# Patient Record
Sex: Female | Born: 1970 | Race: White | Hispanic: No | Marital: Married | Smoking: Never smoker
Health system: Southern US, Community
[De-identification: ages and names within clinical notes are randomized; demographics above are authoritative.]

## PROBLEM LIST (undated history)

## (undated) DIAGNOSIS — E7800 Pure hypercholesterolemia, unspecified: Secondary | ICD-10-CM

## (undated) DIAGNOSIS — M797 Fibromyalgia: Secondary | ICD-10-CM

## (undated) DIAGNOSIS — J302 Other seasonal allergic rhinitis: Secondary | ICD-10-CM

## (undated) DIAGNOSIS — J342 Deviated nasal septum: Secondary | ICD-10-CM

## (undated) DIAGNOSIS — M7918 Myalgia, other site: Secondary | ICD-10-CM

## (undated) DIAGNOSIS — T8859XA Other complications of anesthesia, initial encounter: Secondary | ICD-10-CM

## (undated) DIAGNOSIS — R112 Nausea with vomiting, unspecified: Secondary | ICD-10-CM

## (undated) DIAGNOSIS — G2581 Restless legs syndrome: Secondary | ICD-10-CM

## (undated) DIAGNOSIS — T4145XA Adverse effect of unspecified anesthetic, initial encounter: Secondary | ICD-10-CM

## (undated) DIAGNOSIS — S32009K Unspecified fracture of unspecified lumbar vertebra, subsequent encounter for fracture with nonunion: Secondary | ICD-10-CM

## (undated) DIAGNOSIS — M1990 Unspecified osteoarthritis, unspecified site: Secondary | ICD-10-CM

## (undated) DIAGNOSIS — M5020 Other cervical disc displacement, unspecified cervical region: Secondary | ICD-10-CM

## (undated) DIAGNOSIS — Z9889 Other specified postprocedural states: Secondary | ICD-10-CM

## (undated) HISTORY — PX: NO PAST SURGERIES: SHX2092

## (undated) HISTORY — PX: WISDOM TOOTH EXTRACTION: SHX21

---

## 2015-06-10 ENCOUNTER — Other Ambulatory Visit (HOSPITAL_COMMUNITY): Payer: Self-pay | Admitting: Neurological Surgery

## 2015-06-19 ENCOUNTER — Encounter (HOSPITAL_COMMUNITY)
Admission: RE | Admit: 2015-06-19 | Discharge: 2015-06-19 | Disposition: A | Discharge status: Home / Self Care | Payer: Medicare Other | Source: Ambulatory Visit | Attending: Neurological Surgery | Admitting: Neurological Surgery

## 2015-06-19 ENCOUNTER — Encounter (HOSPITAL_COMMUNITY): Payer: Self-pay

## 2015-06-19 DIAGNOSIS — Z01812 Encounter for preprocedural laboratory examination: Secondary | ICD-10-CM | POA: Insufficient documentation

## 2015-06-19 DIAGNOSIS — IMO0002 Reserved for concepts with insufficient information to code with codable children: Secondary | ICD-10-CM

## 2015-06-19 DIAGNOSIS — Z01818 Encounter for other preprocedural examination: Secondary | ICD-10-CM | POA: Diagnosis present

## 2015-06-19 HISTORY — DX: Other seasonal allergic rhinitis: J30.2

## 2015-06-19 HISTORY — DX: Fibromyalgia: M79.7

## 2015-06-19 HISTORY — DX: Pure hypercholesterolemia, unspecified: E78.00

## 2015-06-19 HISTORY — DX: Myalgia, other site: M79.18

## 2015-06-19 HISTORY — DX: Deviated nasal septum: J34.2

## 2015-06-19 LAB — CBC WITH DIFFERENTIAL/PLATELET
BASOS ABS: 0 10*3/uL (ref 0.0–0.1)
BASOS PCT: 0 %
Eosinophils Absolute: 0.1 10*3/uL (ref 0.0–0.7)
Eosinophils Relative: 1 %
HEMATOCRIT: 43.3 % (ref 36.0–46.0)
HEMOGLOBIN: 14.2 g/dL (ref 12.0–15.0)
LYMPHS PCT: 21 %
Lymphs Abs: 2.7 10*3/uL (ref 0.7–4.0)
MCH: 29.4 pg (ref 26.0–34.0)
MCHC: 32.8 g/dL (ref 30.0–36.0)
MCV: 89.6 fL (ref 78.0–100.0)
Monocytes Absolute: 0.5 10*3/uL (ref 0.1–1.0)
Monocytes Relative: 4 %
NEUTROS ABS: 9.7 10*3/uL — AB (ref 1.7–7.7)
NEUTROS PCT: 74 %
Platelets: 345 10*3/uL (ref 150–400)
RBC: 4.83 MIL/uL (ref 3.87–5.11)
RDW: 13.1 % (ref 11.5–15.5)
WBC: 13 10*3/uL — AB (ref 4.0–10.5)

## 2015-06-19 LAB — BASIC METABOLIC PANEL
ANION GAP: 10 (ref 5–15)
BUN: 10 mg/dL (ref 6–20)
CALCIUM: 9.7 mg/dL (ref 8.9–10.3)
CO2: 26 mmol/L (ref 22–32)
Chloride: 107 mmol/L (ref 101–111)
Creatinine, Ser: 0.83 mg/dL (ref 0.44–1.00)
Glucose, Bld: 92 mg/dL (ref 65–99)
POTASSIUM: 4.7 mmol/L (ref 3.5–5.1)
Sodium: 143 mmol/L (ref 135–145)

## 2015-06-19 LAB — PROTIME-INR
INR: 1.11 (ref 0.00–1.49)
Prothrombin Time: 14.5 seconds (ref 11.6–15.2)

## 2015-06-19 LAB — SURGICAL PCR SCREEN
MRSA, PCR: NEGATIVE
STAPHYLOCOCCUS AUREUS: NEGATIVE

## 2015-06-19 LAB — HCG, SERUM, QUALITATIVE: PREG SERUM: NEGATIVE

## 2015-06-19 NOTE — Progress Notes (Signed)
Patient denies chest pain, shortness of breath, or cardiac test. PCP Dr. Federico FlakeStallworth with [facility] last OV in Care Everywhere.

## 2015-06-19 NOTE — Pre-Procedure Instructions (Signed)
Angela Sims  06/19/2015     Your procedure is scheduled on October 19.  [facility] at 9:15 A.M.  Call this number if you have problems the morning of surgery:  [phone]   Remember:  Do not eat food or drink liquids after midnight.  Take these medicines the morning of surgery with A SIP OF WATER Albuterol, Birth Control   STOP Celebrex, Multiple Vitamins today   STOP/ Do not take Aspirin, Aleve, Naproxen, Advil, Ibuprofen, Motrin, Vitamins, Herbs, or Supplements starting today   Do not wear jewelry, make-up or nail polish.  Do not wear lotions, powders, or perfumes.  You may wear deodorant.  Do not shave 48 hours prior to surgery.  Men may shave face and neck.  Do not bring valuables to the hospital.  [facility] is not responsible for any belongings or valuables.  Contacts, dentures or bridgework may not be worn into surgery.  Leave your suitcase in the car.  After surgery it may be brought to your room.  For patients admitted to the hospital, discharge time will be determined by your treatment team.  Patients discharged the day of surgery will not be allowed to drive home.   [place] - Preparing for Surgery  Before surgery, you can play an important role.  Because skin is not sterile, your skin needs to be as free of germs as possible.  You can reduce the number of germs on you skin by washing with CHG (chlorahexidine gluconate) soap before surgery.  CHG is an antiseptic cleaner which kills germs and bonds with the skin to continue killing germs even after washing.  Please DO NOT use if you have an allergy to CHG or antibacterial soaps.  If your skin becomes reddened/irritated stop using the CHG and inform your nurse when you arrive at Short Stay.  Do not shave (including legs and underarms) for at least 48 hours prior to the first CHG shower.  You may shave your face.  Please follow these instructions carefully:   1.  Shower  with CHG Soap the night before surgery and the morning of Surgery.  2.  If you choose to wash your hair, wash your hair first as usual with your normal shampoo.  3.  After you shampoo, rinse your hair and body thoroughly to remove the shampoo.  4.  Use CHG as you would any other liquid soap.  You can apply CHG directly to the skin and wash gently with scrungie or a clean washcloth.  5.  Apply the CHG Soap to your body ONLY FROM THE NECK DOWN.  Do not use on open wounds or open sores.  Avoid contact with your eyes, ears, mouth and genitals (private parts).  Wash genitals (private parts) with your normal soap.  6.  Wash thoroughly, paying special attention to the area where your surgery will be performed.  7.  Thoroughly rinse your body with warm water from the neck down.  8.  DO NOT shower/wash with your normal soap after using and rinsing off the CHG Soap.  9.  Pat yourself dry with a clean towel.            10.  Wear clean pajamas.            11.  Place clean sheets on your bed the night of your first shower and do not sleep with pets.  Day of Surgery  Do not apply any lotions  the morning of surgery.  Please wear clean clothes to the hospital/surgery center.   Please read over the following fact sheets that you were given. Pain Booklet, Coughing and Deep Breathing and Surgical Site Infection Prevention

## 2015-06-25 ENCOUNTER — Ambulatory Visit (HOSPITAL_COMMUNITY): Payer: No Typology Code available for payment source | Admitting: Certified Registered Nurse Anesthetist

## 2015-06-25 ENCOUNTER — Encounter (HOSPITAL_COMMUNITY)
Admission: RE | Disposition: A | Discharge status: Home / Self Care | Payer: Self-pay | Source: Ambulatory Visit | Attending: Neurological Surgery

## 2015-06-25 ENCOUNTER — Encounter (HOSPITAL_COMMUNITY): Payer: Self-pay | Admitting: Neurological Surgery

## 2015-06-25 ENCOUNTER — Ambulatory Visit (HOSPITAL_COMMUNITY)
Admission: RE | Admit: 2015-06-25 | Discharge: 2015-06-25 | Disposition: A | Discharge status: Home / Self Care | Payer: No Typology Code available for payment source | Source: Ambulatory Visit | Attending: Neurological Surgery | Admitting: Neurological Surgery

## 2015-06-25 ENCOUNTER — Ambulatory Visit (HOSPITAL_COMMUNITY): Payer: No Typology Code available for payment source

## 2015-06-25 DIAGNOSIS — M797 Fibromyalgia: Secondary | ICD-10-CM | POA: Diagnosis not present

## 2015-06-25 DIAGNOSIS — Z419 Encounter for procedure for purposes other than remedying health state, unspecified: Secondary | ICD-10-CM

## 2015-06-25 DIAGNOSIS — Z79899 Other long term (current) drug therapy: Secondary | ICD-10-CM | POA: Insufficient documentation

## 2015-06-25 DIAGNOSIS — Z9889 Other specified postprocedural states: Secondary | ICD-10-CM

## 2015-06-25 DIAGNOSIS — M5117 Intervertebral disc disorders with radiculopathy, lumbosacral region: Secondary | ICD-10-CM | POA: Insufficient documentation

## 2015-06-25 HISTORY — PX: LUMBAR LAMINECTOMY/DECOMPRESSION MICRODISCECTOMY: SHX5026

## 2015-06-25 SURGERY — LUMBAR LAMINECTOMY/DECOMPRESSION MICRODISCECTOMY 1 LEVEL
Anesthesia: General | Site: Back | Laterality: Right

## 2015-06-25 MED ORDER — CELECOXIB 200 MG PO CAPS
200.0000 mg | ORAL_CAPSULE | Freq: Two times a day (BID) | ORAL | Status: DC
  Administered 2015-06-25: 200 mg via ORAL
  Filled 2015-06-25: qty 1

## 2015-06-25 MED ORDER — MENTHOL 3 MG MT LOZG: 1.0000 | LOZENGE | OROMUCOSAL | Status: DC | PRN

## 2015-06-25 MED ORDER — METHOCARBAMOL 500 MG PO TABS: 500.0000 mg | ORAL_TABLET | Freq: Four times a day (QID) | ORAL | Status: DC | PRN

## 2015-06-25 MED ORDER — LACTATED RINGERS IV SOLN
INTRAVENOUS | Status: DC
  Administered 2015-06-25 (×2): via INTRAVENOUS

## 2015-06-25 MED ORDER — SCOPOLAMINE 1 MG/3DAYS TD PT72
MEDICATED_PATCH | TRANSDERMAL | Status: DC | PRN
  Administered 2015-06-25: 1 via TRANSDERMAL

## 2015-06-25 MED ORDER — ROCURONIUM BROMIDE 50 MG/5ML IV SOLN
INTRAVENOUS | Status: AC
  Filled 2015-06-25: qty 1

## 2015-06-25 MED ORDER — HYDROMORPHONE HCL 1 MG/ML IJ SOLN
INTRAMUSCULAR | Status: AC
  Filled 2015-06-25: qty 1

## 2015-06-25 MED ORDER — CEFAZOLIN SODIUM 1-5 GM-% IV SOLN
1.0000 g | Freq: Three times a day (TID) | INTRAVENOUS | Status: DC
  Filled 2015-06-25 (×2): qty 50

## 2015-06-25 MED ORDER — POTASSIUM CHLORIDE IN NACL 20-0.9 MEQ/L-% IV SOLN
INTRAVENOUS | Status: DC
  Filled 2015-06-25 (×2): qty 1000

## 2015-06-25 MED ORDER — DEXAMETHASONE 4 MG PO TABS
4.0000 mg | ORAL_TABLET | Freq: Four times a day (QID) | ORAL | Status: DC
  Administered 2015-06-25: 4 mg via ORAL
  Filled 2015-06-25: qty 1

## 2015-06-25 MED ORDER — OXYCODONE HCL 5 MG PO TABS: 5.0000 mg | ORAL_TABLET | Freq: Once | ORAL | Status: DC | PRN

## 2015-06-25 MED ORDER — METHOCARBAMOL 1000 MG/10ML IJ SOLN
500.0000 mg | Freq: Four times a day (QID) | INTRAVENOUS | Status: DC | PRN
  Filled 2015-06-25: qty 5

## 2015-06-25 MED ORDER — FENTANYL CITRATE (PF) 100 MCG/2ML IJ SOLN
INTRAMUSCULAR | Status: DC | PRN
  Administered 2015-06-25 (×2): 50 ug via INTRAVENOUS
  Administered 2015-06-25: 100 ug via INTRAVENOUS

## 2015-06-25 MED ORDER — PROPOFOL 10 MG/ML IV BOLUS
INTRAVENOUS | Status: DC | PRN
  Administered 2015-06-25: 150 mg via INTRAVENOUS

## 2015-06-25 MED ORDER — MIDAZOLAM HCL 5 MG/5ML IJ SOLN
INTRAMUSCULAR | Status: DC | PRN
  Administered 2015-06-25: 2 mg via INTRAVENOUS

## 2015-06-25 MED ORDER — ONDANSETRON HCL 4 MG/2ML IJ SOLN
INTRAMUSCULAR | Status: DC | PRN
  Administered 2015-06-25: 4 mg via INTRAVENOUS

## 2015-06-25 MED ORDER — ACETAMINOPHEN 650 MG RE SUPP: 650.0000 mg | RECTAL | Status: DC | PRN

## 2015-06-25 MED ORDER — GLYCOPYRROLATE 0.2 MG/ML IJ SOLN
INTRAMUSCULAR | Status: DC | PRN
  Administered 2015-06-25: 0.4 mg via INTRAVENOUS

## 2015-06-25 MED ORDER — SODIUM CHLORIDE 0.9 % IJ SOLN: 3.0000 mL | INTRAMUSCULAR | Status: DC | PRN

## 2015-06-25 MED ORDER — DEXAMETHASONE SODIUM PHOSPHATE 10 MG/ML IJ SOLN
10.0000 mg | INTRAMUSCULAR | Status: AC
  Administered 2015-06-25: 10 mg via INTRAVENOUS
  Filled 2015-06-25: qty 1

## 2015-06-25 MED ORDER — ONDANSETRON HCL 4 MG/2ML IJ SOLN
INTRAMUSCULAR | Status: AC
  Filled 2015-06-25: qty 2

## 2015-06-25 MED ORDER — ALBUTEROL SULFATE (2.5 MG/3ML) 0.083% IN NEBU: 3.0000 mL | INHALATION_SOLUTION | Freq: Four times a day (QID) | RESPIRATORY_TRACT | Status: DC | PRN

## 2015-06-25 MED ORDER — SODIUM CHLORIDE 0.9 % IR SOLN
Status: DC | PRN
  Administered 2015-06-25: 11:00:00

## 2015-06-25 MED ORDER — ACETAMINOPHEN 325 MG PO TABS: 650.0000 mg | ORAL_TABLET | ORAL | Status: DC | PRN

## 2015-06-25 MED ORDER — MORPHINE SULFATE (PF) 2 MG/ML IV SOLN: 1.0000 mg | INTRAVENOUS | Status: DC | PRN

## 2015-06-25 MED ORDER — FENTANYL CITRATE (PF) 250 MCG/5ML IJ SOLN
INTRAMUSCULAR | Status: AC
  Filled 2015-06-25: qty 5

## 2015-06-25 MED ORDER — ZOLPIDEM TARTRATE 5 MG PO TABS: 2.5000 mg | ORAL_TABLET | Freq: Every evening | ORAL | Status: DC | PRN

## 2015-06-25 MED ORDER — HEMOSTATIC AGENTS (NO CHARGE) OPTIME
TOPICAL | Status: DC | PRN
  Administered 2015-06-25: 1 via TOPICAL

## 2015-06-25 MED ORDER — CEFAZOLIN SODIUM-DEXTROSE 2-3 GM-% IV SOLR
2.0000 g | INTRAVENOUS | Status: AC
  Administered 2015-06-25: 2 g via INTRAVENOUS
  Filled 2015-06-25: qty 50

## 2015-06-25 MED ORDER — PROPOFOL 10 MG/ML IV BOLUS
INTRAVENOUS | Status: AC
  Filled 2015-06-25: qty 20

## 2015-06-25 MED ORDER — OXYCODONE HCL 5 MG/5ML PO SOLN: 5.0000 mg | Freq: Once | ORAL | Status: DC | PRN

## 2015-06-25 MED ORDER — HYDROMORPHONE HCL 1 MG/ML IJ SOLN
0.2500 mg | INTRAMUSCULAR | Status: DC | PRN
  Administered 2015-06-25 (×2): 0.25 mg via INTRAVENOUS

## 2015-06-25 MED ORDER — ROCURONIUM BROMIDE 100 MG/10ML IV SOLN
INTRAVENOUS | Status: DC | PRN
  Administered 2015-06-25: 40 mg via INTRAVENOUS

## 2015-06-25 MED ORDER — PHENOL 1.4 % MT LIQD: 1.0000 | OROMUCOSAL | Status: DC | PRN

## 2015-06-25 MED ORDER — ONDANSETRON HCL 4 MG/2ML IJ SOLN: 4.0000 mg | INTRAMUSCULAR | Status: DC | PRN

## 2015-06-25 MED ORDER — LIDOCAINE HCL (CARDIAC) 20 MG/ML IV SOLN
INTRAVENOUS | Status: DC | PRN
  Administered 2015-06-25: 60 mg via INTRAVENOUS

## 2015-06-25 MED ORDER — SODIUM CHLORIDE 0.9 % IJ SOLN: 3.0000 mL | Freq: Two times a day (BID) | INTRAMUSCULAR | Status: DC

## 2015-06-25 MED ORDER — NEOSTIGMINE METHYLSULFATE 10 MG/10ML IV SOLN
INTRAVENOUS | Status: DC | PRN
  Administered 2015-06-25: 3 mg via INTRAVENOUS

## 2015-06-25 MED ORDER — LIDOCAINE HCL (CARDIAC) 20 MG/ML IV SOLN
INTRAVENOUS | Status: AC
  Filled 2015-06-25: qty 5

## 2015-06-25 MED ORDER — BUPIVACAINE HCL (PF) 0.25 % IJ SOLN
INTRAMUSCULAR | Status: DC | PRN
  Administered 2015-06-25: 30 mL

## 2015-06-25 MED ORDER — PROMETHAZINE HCL 25 MG/ML IJ SOLN: 6.2500 mg | INTRAMUSCULAR | Status: DC | PRN

## 2015-06-25 MED ORDER — THROMBIN 5000 UNITS EX SOLR
CUTANEOUS | Status: DC | PRN
  Administered 2015-06-25 (×3): 5000 [IU] via TOPICAL

## 2015-06-25 MED ORDER — MIDAZOLAM HCL 2 MG/2ML IJ SOLN
INTRAMUSCULAR | Status: AC
  Filled 2015-06-25: qty 4

## 2015-06-25 MED ORDER — 0.9 % SODIUM CHLORIDE (POUR BTL) OPTIME
TOPICAL | Status: DC | PRN
  Administered 2015-06-25: 1000 mL

## 2015-06-25 MED ORDER — HYDROCODONE-ACETAMINOPHEN 5-325 MG PO TABS
1.0000 | ORAL_TABLET | ORAL | Status: DC | PRN
  Administered 2015-06-25: 1 via ORAL
  Filled 2015-06-25: qty 1

## 2015-06-25 MED ORDER — DEXAMETHASONE SODIUM PHOSPHATE 4 MG/ML IJ SOLN: 4.0000 mg | Freq: Four times a day (QID) | INTRAMUSCULAR | Status: DC

## 2015-06-25 SURGICAL SUPPLY — 40 items
BAG DECANTER FOR FLEXI CONT (MISCELLANEOUS) ×2 IMPLANT
BENZOIN TINCTURE PRP APPL 2/3 (GAUZE/BANDAGES/DRESSINGS) ×2 IMPLANT
BUR MATCHSTICK NEURO 3.0 LAGG (BURR) ×2 IMPLANT
CANISTER SUCT 3000ML PPV (MISCELLANEOUS) ×2 IMPLANT
DRAPE LAPAROTOMY 100X72X124 (DRAPES) ×2 IMPLANT
DRAPE MICROSCOPE LEICA (MISCELLANEOUS) ×2 IMPLANT
DRAPE POUCH INSTRU U-SHP 10X18 (DRAPES) ×2 IMPLANT
DRAPE SURG 17X23 STRL (DRAPES) ×2 IMPLANT
DRSG OPSITE 4X5.5 SM (GAUZE/BANDAGES/DRESSINGS) ×2 IMPLANT
DRSG TELFA 3X8 NADH (GAUZE/BANDAGES/DRESSINGS) ×2 IMPLANT
DURAPREP 26ML APPLICATOR (WOUND CARE) ×2 IMPLANT
ELECT CAUTERY BLADE 6.4 (BLADE) ×2 IMPLANT
ELECT REM PT RETURN 9FT ADLT (ELECTROSURGICAL) ×2
ELECTRODE REM PT RTRN 9FT ADLT (ELECTROSURGICAL) ×1 IMPLANT
GAUZE SPONGE 4X4 16PLY XRAY LF (GAUZE/BANDAGES/DRESSINGS) IMPLANT
GLOVE BIO SURGEON STRL SZ8 (GLOVE) ×2 IMPLANT
GOWN STRL REUS W/ TWL LRG LVL3 (GOWN DISPOSABLE) ×2 IMPLANT
GOWN STRL REUS W/ TWL XL LVL3 (GOWN DISPOSABLE) ×1 IMPLANT
GOWN STRL REUS W/TWL 2XL LVL3 (GOWN DISPOSABLE) IMPLANT
GOWN STRL REUS W/TWL LRG LVL3 (GOWN DISPOSABLE) ×2
GOWN STRL REUS W/TWL XL LVL3 (GOWN DISPOSABLE) ×1
HEMOSTAT POWDER KIT SURGIFOAM (HEMOSTASIS) ×2 IMPLANT
KIT BASIN OR (CUSTOM PROCEDURE TRAY) ×2 IMPLANT
KIT ROOM TURNOVER OR (KITS) ×2 IMPLANT
NEEDLE HYPO 25X1 1.5 SAFETY (NEEDLE) ×2 IMPLANT
NEEDLE SPNL 20GX3.5 QUINCKE YW (NEEDLE) IMPLANT
NS IRRIG 1000ML POUR BTL (IV SOLUTION) ×2 IMPLANT
PACK LAMINECTOMY NEURO (CUSTOM PROCEDURE TRAY) ×2 IMPLANT
PAD ARMBOARD 7.5X6 YLW CONV (MISCELLANEOUS) ×6 IMPLANT
PENCIL BUTTON HOLSTER BLD 10FT (ELECTRODE) ×2 IMPLANT
RUBBERBAND STERILE (MISCELLANEOUS) ×4 IMPLANT
SPONGE SURGIFOAM ABS GEL SZ50 (HEMOSTASIS) ×4 IMPLANT
STRIP CLOSURE SKIN 1/2X4 (GAUZE/BANDAGES/DRESSINGS) ×2 IMPLANT
SUT VIC AB 0 CT1 18XCR BRD8 (SUTURE) ×1 IMPLANT
SUT VIC AB 0 CT1 8-18 (SUTURE) ×1
SUT VIC AB 2-0 CP2 18 (SUTURE) ×2 IMPLANT
SUT VIC AB 3-0 SH 8-18 (SUTURE) ×2 IMPLANT
TOWEL OR 17X24 6PK STRL BLUE (TOWEL DISPOSABLE) ×2 IMPLANT
TOWEL OR 17X26 10 PK STRL BLUE (TOWEL DISPOSABLE) ×2 IMPLANT
WATER STERILE IRR 1000ML POUR (IV SOLUTION) ×2 IMPLANT

## 2015-06-25 NOTE — Progress Notes (Signed)
Pt and husband given D/C instructions with Rx, verbal understanding was provided. Pt's incision is clean and dry with no sign of infection. Pt's IV was removed prior to D/C. Pt D/C'd home via wheelchair per MD order. Pt is stable @ D/C and has no other needs at this time. Glennon Kopko, RN  

## 2015-06-25 NOTE — H&P (Signed)
Subjective: Patient is a 44 y.o. female admitted for L5-S1 micro. Onset of symptoms was several months ago, gradually worsening since that time.  The pain is rated severe, and is located at the across the lower back and radiates to leg. The pain is described as aching and occurs all day. The symptoms have been progressive. Symptoms are exacerbated by exercise. MRI or CT showed HNP L5-S1   Past Medical History  Diagnosis Date  . High cholesterol   . Fibromyalgia   . Myofascial pain syndrome   . Seasonal allergies   . Deviated septum     right side    Past Surgical History  Procedure Laterality Date  . No past surgeries    . Wisdom tooth extraction      Prior to Admission medications   Medication Sig Start Date End Date Taking? Authorizing Provider  albuterol (PROVENTIL HFA;VENTOLIN HFA) 108 (90 BASE) MCG/ACT inhaler Inhale 1-2 puffs into the lungs every 6 (six) hours as needed for wheezing or shortness of breath.   Yes Historical Provider, MD  celecoxib (CELEBREX) 200 MG capsule Take 200 mg by mouth 3 (three) times daily.   Yes Historical Provider, MD  Multiple Vitamins-Minerals (MULTIVITAMIN PO) Take 1 tablet by mouth daily.   Yes Historical Provider, MD  norethindrone-ethinyl estradiol 1/35 (ORTHO-NOVUM, NORTREL,CYCLAFEM) tablet Take 1 tablet by mouth daily.   Yes Historical Provider, MD  fluticasone (FLONASE) 50 MCG/ACT nasal spray Place 1 spray into both nostrils daily.    Historical Provider, MD  zolpidem (AMBIEN) 10 MG tablet Take 2.5 mg by mouth at bedtime as needed for sleep. Patient breaks tablet up into 4 and only takes 1/4 of a tablet when needed    Historical Provider, MD   No Known Allergies  Social History  Substance Use Topics  . Smoking status: Never Smoker   . Smokeless tobacco: Never Used  . Alcohol Use: Yes     Comment: 1 or 2 drinks when going out    History reviewed. No pertinent family history.   Review of Systems  Positive ROS: neg  All other systems have  been reviewed and were otherwise negative with the exception of those mentioned in the HPI and as above.  Objective: Vital signs in last 24 hours: Temp:  [98.1 F (36.7 C)] 98.1 F (36.7 C) (10/19 0908) Pulse Rate:  [98] 98 (10/19 0908) Resp:  [20] 20 (10/19 0908) BP: (153)/(87) 153/87 mmHg (10/19 0908) SpO2:  [99 %] 99 % (10/19 0908) Weight:  [136 lb (61.689 kg)] 136 lb (61.689 kg) (10/19 0908)  General Appearance: Alert, cooperative, no distress, appears stated age Head: Normocephalic, without obvious abnormality, atraumatic Eyes: PERRL, conjunctiva/corneas clear, EOM's intact    Neck: Supple, symmetrical, trachea midline Back: Symmetric, no curvature, ROM normal, no CVA tenderness Lungs:  respirations unlabored Heart: Regular rate and rhythm Abdomen: Soft, non-tender Extremities: Extremities normal, atraumatic, no cyanosis or edema Pulses: 2+ and symmetric all extremities Skin: Skin color, texture, turgor normal, no rashes or lesions  NEUROLOGIC:   Mental status: Alert and oriented x4,  no aphasia, good attention span, fund of knowledge, and memory Motor Exam - grossly normal Sensory Exam - grossly normal Reflexes: 1= Coordination - grossly normal Gait - grossly normal Balance - grossly normal Cranial Nerves: I: smell Not tested  II: visual acuity  OS: nl    OD: nl  II: visual fields Full to confrontation  II: pupils Equal, round, reactive to light  III,VII: ptosis None  III,IV,VI: extraocular  muscles  Full ROM  V: mastication Normal  V: facial light touch sensation  Normal  V,VII: corneal reflex  Present  VII: facial muscle function - upper  Normal  VII: facial muscle function - lower Normal  VIII: hearing Not tested  IX: soft palate elevation  Normal  IX,X: gag reflex Present  XI: trapezius strength  5/5  XI: sternocleidomastoid strength 5/5  XI: neck flexion strength  5/5  XII: tongue strength  Normal    Data Review Lab Results  Component Value Date    WBC 13.0* 06/19/2015   HGB 14.2 06/19/2015   HCT 43.3 06/19/2015   MCV 89.6 06/19/2015   PLT 345 06/19/2015   Lab Results  Component Value Date   NA 143 06/19/2015   K 4.7 06/19/2015   CL 107 06/19/2015   CO2 26 06/19/2015   BUN 10 06/19/2015   CREATININE 0.83 06/19/2015   GLUCOSE 92 06/19/2015   Lab Results  Component Value Date   INR 1.11 06/19/2015    Assessment/Plan: Patient admitted for L5-S1 micro. Patient has failed a reasonable attempt at conservative therapy.  I explained the condition and procedure to the patient and answered any questions.  Patient wishes to proceed with procedure as planned. Understands risks/ benefits and typical outcomes of procedure.   JONES,DAVID S 06/25/2015 9:24 AM

## 2015-06-25 NOTE — Transfer of Care (Signed)
Immediate Anesthesia Transfer of Care Note  Patient: Angela Sims  Procedure(s) Performed: Procedure(s): LUMBAR LAMINECTOMY/DECOMPRESSION MICRODISCECTOMY 1 LEVEL (Right)  Patient Location: PACU  Anesthesia Type:General  Level of Consciousness: awake, patient cooperative and responds to stimulation  Airway & Oxygen Therapy: Patient Spontanous Breathing and Patient connected to nasal cannula oxygen  Post-op Assessment: Report given to RN and Post -op Vital signs reviewed and stable  Post vital signs: Reviewed and stable  Last Vitals:  Filed Vitals:   06/25/15 0908  BP: 153/87  Pulse: 98  Temp: 36.7 C  Resp: 20    Complications: No apparent anesthesia complications

## 2015-06-25 NOTE — Anesthesia Procedure Notes (Signed)
Procedure Name: Intubation Date/Time: 06/25/2015 11:21 AM Performed by: Dairl PonderJIANG, Yelitza Reach Pre-anesthesia Checklist: Patient identified, Timeout performed, Emergency Drugs available, Suction available and Patient being monitored Patient Re-evaluated:Patient Re-evaluated prior to inductionOxygen Delivery Method: Circle system utilized Preoxygenation: Pre-oxygenation with 100% oxygen Intubation Type: IV induction Ventilation: Mask ventilation without difficulty Laryngoscope Size: Mac and 3 Grade View: Grade I Tube type: Oral Tube size: 7.0 mm Number of attempts: 1 Airway Equipment and Method: Stylet Placement Confirmation: ETT inserted through vocal cords under direct vision,  breath sounds checked- equal and bilateral and positive ETCO2 Secured at: 22 cm Tube secured with: Tape Dental Injury: Teeth and Oropharynx as per pre-operative assessment

## 2015-06-25 NOTE — Anesthesia Preprocedure Evaluation (Signed)
Anesthesia Evaluation  Patient identified by MRN, date of birth, ID band Patient awake    Reviewed: Allergy & Precautions, NPO status , Patient's Chart, lab work & pertinent test results  Airway Mallampati: II  TM Distance: >3 FB Neck ROM: Full    Dental  (+) Teeth Intact, Dental Advisory Given   Pulmonary neg pulmonary ROS,    breath sounds clear to auscultation       Cardiovascular negative cardio ROS   Rhythm:Regular Rate:Normal     Neuro/Psych negative neurological ROS     GI/Hepatic negative GI ROS, Neg liver ROS,   Endo/Other  negative endocrine ROS  Renal/GU negative Renal ROS     Musculoskeletal  (+) Fibromyalgia -  Abdominal   Peds  Hematology negative hematology ROS (+)   Anesthesia Other Findings   Reproductive/Obstetrics                             Anesthesia Physical Anesthesia Plan  ASA: I  Anesthesia Plan: General   Post-op Pain Management:    Induction: Intravenous  Airway Management Planned: Oral ETT  Additional Equipment:   Intra-op Plan:   Post-operative Plan: Extubation in OR  Informed Consent: I have reviewed the patients History and Physical, chart, labs and discussed the procedure including the risks, benefits and alternatives for the proposed anesthesia with the patient or authorized representative who has indicated his/her understanding and acceptance.   Dental advisory given  Plan Discussed with: CRNA  Anesthesia Plan Comments:         Anesthesia Quick Evaluation

## 2015-06-25 NOTE — Anesthesia Postprocedure Evaluation (Signed)
  Anesthesia Post-op Note  Patient: Angela Sims  Procedure(s) Performed: Procedure(s): LUMBAR LAMINECTOMY/DECOMPRESSION MICRODISCECTOMY 1 LEVEL; RIGHT  (Right)  Patient Location: PACU  Anesthesia Type:General  Level of Consciousness: awake and alert   Airway and Oxygen Therapy: Patient Spontanous Breathing  Post-op Pain: mild  Post-op Assessment: Post-op Vital signs reviewed LLE Motor Response: Purposeful movement LLE Sensation: Full sensation RLE Motor Response: Purposeful movement RLE Sensation: Full sensation      Post-op Vital Signs: Reviewed  Last Vitals:  Filed Vitals:   06/25/15 1346  BP: 124/86  Pulse: 85  Temp: 37.1 C  Resp: 18    Complications: No apparent anesthesia complications

## 2015-06-25 NOTE — Op Note (Signed)
06/25/2015  12:20 PM  PATIENT:  Angela Sims  44 y.o. female  PRE-OPERATIVE DIAGNOSIS:  Right L5-S1 herniated disc with right S1 radiculopathy  POST-OPERATIVE DIAGNOSIS:  Same  PROCEDURE:  Right L5-S1 hemilaminectomy and medial facetectomy foraminotomy followed by microdiscectomy L5-S1 on the right  SURGEON:  Marikay Alaravid Ayomide Purdy, MD  ASSISTANTS: Dr. Mikal Planeabell  ANESTHESIA:   General  EBL: < 25 ml  Total I/O In: 700 [I.V.:700] Out: -   BLOOD ADMINISTERED:none  DRAINS: none   SPECIMEN:  No Specimen  INDICATION FOR PROCEDURE: This patient presented with a long history of right posterior leg pain. MRI showed a herniated disc at L5-S1 on the right. She tried medical management without relief. Recommended a right L5-S1 microdiscectomy. Patient understood the risks, benefits, and alternatives and potential outcomes and wished to proceed.  PROCEDURE DETAILS: The patient was taken to the operating room and after induction of adequate generalized endotracheal anesthesia, the patient was rolled into the prone position on the Wilson frame and all pressure points were padded. The lumbar region was cleaned and then prepped with DuraPrep and draped in the usual sterile fashion. 5 cc of local anesthesia was injected and then a dorsal midline incision was made and carried down to the lumbo sacral fascia. The fascia was opened and the paraspinous musculature was taken down in a subperiosteal fashion to expose L5-S1 on the right. Intraoperative x-ray confirmed my level, and then I used a combination of the high-speed drill and the Kerrison punches to perform a hemilaminectomy, medial facetectomy, and foraminotomy at L5-S1 on the right. The underlying yellow ligament was opened and removed in a piecemeal fashion to expose the underlying dura and exiting nerve root. I undercut the lateral recess and dissected down until I was medial to and distal to the pedicle. The nerve root was well decompressed. We then  gently retracted the nerve root medially with a retractor, coagulated the epidural venous vasculature, and incised the disc space. I performed a thorough intradiscal discectomy with pituitary rongeurs and curettes, until I had a nice decompression of the nerve root and the midline. I then palpated with a coronary dilator along the nerve root and into the foramen to assure adequate decompression. I felt no more compression of the nerve root. I irrigated with saline solution containing bacitracin. Achieved hemostasis with bipolar cautery, lined the dura with Gelfoam, and then closed the fascia with 0 Vicryl. I closed the subcutaneous tissues with 2-0 Vicryl and the subcuticular tissues with 3-0 Vicryl. The skin was then closed with benzoin and Steri-Strips. The drapes were removed, a sterile dressing was applied. The patient was awakened from general anesthesia and transferred to the recovery room in stable condition. At the end of the procedure all sponge, needle and instrument counts were correct.   PLAN OF CARE: Admit for overnight observation  PATIENT DISPOSITION:  PACU - hemodynamically stable.   Delay start of Pharmacological VTE agent (>24hrs) due to surgical blood loss or risk of bleeding:  yes

## 2015-06-25 NOTE — Plan of Care (Signed)
Problem: Consults Goal: Diagnosis - Spinal Surgery Outcome: Completed/Met Date Met:  06/25/15 Microdiscectomy

## 2015-06-26 ENCOUNTER — Encounter (HOSPITAL_COMMUNITY): Payer: Self-pay | Admitting: Neurological Surgery

## 2015-06-26 NOTE — Discharge Summary (Signed)
Physician Discharge Summary  Patient ID: Angela Sims MRN: [id number] DOB/AGE: 06-Jan-1971 44 y.o.  Admit date: 06/25/2015 Discharge date: 06/26/2015  Admission Diagnoses: Herniated disc L5-S1    Discharge Diagnoses: Same   Discharged Condition: good  Hospital Course: The patient was admitted on 06/25/2015 and taken to the operating room where the patient underwent L5-S1 microdiscectomy. The patient tolerated the procedure well and was taken to the recovery room and then to the floor in stable condition. The hospital course was routine. There were no complications. The wound remained clean dry and intact. Pt had appropriate back soreness. No complaints of leg pain or new N/T/W. The patient remained afebrile with stable vital signs, and tolerated a regular diet. The patient continued to increase activities, and pain was well controlled with oral pain medications.   Consults: None  Significant Diagnostic Studies:  Results for orders placed or performed during the hospital encounter of 06/19/15  Surgical pcr screen  Result Value Ref Range   MRSA, PCR NEGATIVE NEGATIVE   Staphylococcus aureus NEGATIVE NEGATIVE  Basic metabolic panel  Result Value Ref Range   Sodium 143 135 - 145 mmol/L   Potassium 4.7 3.5 - 5.1 mmol/L   Chloride 107 101 - 111 mmol/L   CO2 26 22 - 32 mmol/L   Glucose, Bld 92 65 - 99 mg/dL   BUN 10 6 - 20 mg/dL   Creatinine, Ser 4.090.83 0.44 - 1.00 mg/dL   Calcium 9.7 8.9 - 81.110.3 mg/dL   GFR calc non Af Amer >60 >60 mL/min   GFR calc Af Amer >60 >60 mL/min   Anion gap 10 5 - 15  CBC WITH DIFFERENTIAL  Result Value Ref Range   WBC 13.0 (H) 4.0 - 10.5 K/uL   RBC 4.83 3.87 - 5.11 MIL/uL   Hemoglobin 14.2 12.0 - 15.0 g/dL   HCT 91.443.3 78.236.0 - 95.646.0 %   MCV 89.6 78.0 - 100.0 fL   MCH 29.4 26.0 - 34.0 pg   MCHC 32.8 30.0 - 36.0 g/dL   RDW 21.313.1 08.611.5 - 57.815.5 %   Platelets 345 150 - 400 K/uL   Neutrophils Relative % 74 %   Neutro Abs 9.7 (H) 1.7 - 7.7 K/uL   Lymphocytes  Relative 21 %   Lymphs Abs 2.7 0.7 - 4.0 K/uL   Monocytes Relative 4 %   Monocytes Absolute 0.5 0.1 - 1.0 K/uL   Eosinophils Relative 1 %   Eosinophils Absolute 0.1 0.0 - 0.7 K/uL   Basophils Relative 0 %   Basophils Absolute 0.0 0.0 - 0.1 K/uL  Protime-INR  Result Value Ref Range   Prothrombin Time 14.5 11.6 - 15.2 seconds   INR 1.11 0.00 - 1.49  hCG, serum, qualitative  Result Value Ref Range   Preg, Serum NEGATIVE NEGATIVE    Chest 2 View  06/19/2015  CLINICAL DATA:  Preop for lumbar spine surgery EXAM: CHEST  2 VIEW COMPARISON:  None. FINDINGS: No active infiltrate or effusion is seen. Mediastinal and hilar contours are unremarkable. The heart is within normal limits in size. No bony abnormality is seen. IMPRESSION: No active cardiopulmonary disease. Electronically Signed   By: Dwyane DeePaul  Barry M.D.   On: 06/19/2015 10:15   Dg Lumbar Spine 1 View  06/25/2015  CLINICAL DATA:  L5-S1 laminectomy EXAM: LUMBAR SPINE - 1 VIEW COMPARISON:  None. FINDINGS: Posterior surgical instruments are in place directed at the L5-S1 level. IMPRESSION: Localization of L5-S1. Electronically Signed   By: Charlett NoseKevin  Dover M.D.  On: 06/25/2015 13:59    Antibiotics:  Anti-infectives    Start     Dose/Rate Route Frequency Ordered Stop   06/25/15 1930  ceFAZolin (ANCEF) IVPB 1 g/50 mL premix  Status:  Discontinued     1 g 100 mL/hr over 30 Minutes Intravenous Every 8 hours 06/25/15 1355 06/25/15 2250   06/25/15 1110  bacitracin 50,000 Units in sodium chloride irrigation 0.9 % 500 mL irrigation  Status:  Discontinued       As needed 06/25/15 1235 06/25/15 1238   06/25/15 0916  ceFAZolin (ANCEF) IVPB 2 g/50 mL premix     2 g 100 mL/hr over 30 Minutes Intravenous On call to O.R. 06/25/15 0916 06/25/15 1126      Discharge Exam: Blood pressure 107/65, pulse 92, temperature 98.8 F (37.1 C), temperature source Oral, resp. rate 18, height 5' 1.5" (1.562 m), weight 136 lb (61.689 kg), last menstrual period  05/26/2015, SpO2 98 %. Neurologic: Grossly normal Dressing dry  Discharge Medications:     Medication List    ASK your doctor about these medications        albuterol 108 (90 BASE) MCG/ACT inhaler  Commonly known as:  PROVENTIL HFA;VENTOLIN HFA  Inhale 1-2 puffs into the lungs every 6 (six) hours as needed for wheezing or shortness of breath.     celecoxib 200 MG capsule  Commonly known as:  CELEBREX  Take 200 mg by mouth 3 (three) times daily.     fluticasone 50 MCG/ACT nasal spray  Commonly known as:  FLONASE  Place 1 spray into both nostrils daily.     MULTIVITAMIN PO  Take 1 tablet by mouth daily.     norethindrone-ethinyl estradiol 1/35 tablet  Commonly known as:  ORTHO-NOVUM, NORTREL,CYCLAFEM  Take 1 tablet by mouth daily.     zolpidem 10 MG tablet  Commonly known as:  AMBIEN  Take 2.5 mg by mouth at bedtime as needed for sleep. Patient breaks tablet up into 4 and only takes 1/4 of a tablet when needed        Disposition: home   Final Dx: L5-S1 microdiscectomy       Signed: Keeleigh Terris S 06/26/2015, 8:56 AM

## 2015-10-14 DIAGNOSIS — M5137 Other intervertebral disc degeneration, lumbosacral region: Secondary | ICD-10-CM | POA: Insufficient documentation

## 2015-10-22 ENCOUNTER — Other Ambulatory Visit: Payer: Self-pay | Admitting: Neurological Surgery

## 2015-10-24 ENCOUNTER — Telehealth: Payer: Self-pay | Admitting: Vascular Surgery

## 2015-10-24 NOTE — Telephone Encounter (Signed)
Angela Sims returned the call is now aware of her appointments. Her Lumbar images are in EPIC, dpm

## 2015-10-24 NOTE — Telephone Encounter (Signed)
-----   Message from Phillips Odor, RN sent at 10/23/2015 10:14 AM EST ----- Regarding: NEEDS OFFICE CONSULT WITH DR. EARLY Please schedule for a new patient consult with Dr. Arbie Cookey, prior to ALIF on 12/05/15 with Dr. Yetta Barre.  Please remind pt. to bring a copy of LS spine films to appt. With her.

## 2015-10-24 NOTE — Telephone Encounter (Signed)
LVM for Angela Sims to call back and ask for Angela Sims- will need to inform of consultation prior to ALIF procedure.

## 2015-10-28 ENCOUNTER — Encounter: Payer: Self-pay | Admitting: Vascular Surgery

## 2015-11-14 ENCOUNTER — Encounter: Payer: Self-pay | Admitting: Vascular Surgery

## 2015-11-18 ENCOUNTER — Encounter: Payer: Self-pay | Admitting: Vascular Surgery

## 2015-11-18 ENCOUNTER — Ambulatory Visit (INDEPENDENT_AMBULATORY_CARE_PROVIDER_SITE_OTHER): Payer: Medicare Other | Admitting: Vascular Surgery

## 2015-11-18 VITALS — BP 127/73 | HR 84 | Temp 97.3°F | Resp 18 | Ht 62.0 in | Wt 136.3 lb

## 2015-11-18 DIAGNOSIS — M5137 Other intervertebral disc degeneration, lumbosacral region: Secondary | ICD-10-CM | POA: Diagnosis not present

## 2015-11-18 NOTE — Progress Notes (Signed)
Vascular and Vein Specialist of [facility]  Patient name: Angela Sims MRN: [id number] DOB: 1971/06/02 Sex: female  REASON FOR CONSULT: Discussion anterior approach for lumbar surgery  HPI: Angela Sims is a 45 y.o. female, who is seen today for discussion of anterior approach for L5-S1 disc surgery with Dr. Yetta Barre. She had a prior posterior discectomy and has had recurrent pain. Dr. Yetta Barre is suggested anterior approach and I'm seeing her today for discussion of this. She has never had intra-abdominal surgery. She has no history of arterial insufficiency. She has a severe back pain.  Past Medical History  Diagnosis Date  . High cholesterol   . Fibromyalgia   . Myofascial pain syndrome   . Seasonal allergies   . Deviated septum     right side    History reviewed. No pertinent family history.  SOCIAL HISTORY: Social History   Social History  . Marital Status: Married    Spouse Name: N/A  . Number of Children: N/A  . Years of Education: N/A   Occupational History  . Not on file.   Social History Main Topics  . Smoking status: Never Smoker   . Smokeless tobacco: Never Used  . Alcohol Use: Yes     Comment: 1 or 2 drinks when going out  . Drug Use: No  . Sexual Activity: Not on file   Other Topics Concern  . Not on file   Social History Narrative    No Known Allergies  Current Outpatient Prescriptions  Medication Sig Dispense Refill  . albuterol (PROVENTIL HFA;VENTOLIN HFA) 108 (90 BASE) MCG/ACT inhaler Inhale 1-2 puffs into the lungs every 6 (six) hours as needed for wheezing or shortness of breath.    . baclofen (LIORESAL) 10 MG tablet 10 mg 3 (three) times daily.    . celecoxib (CELEBREX) 200 MG capsule Take 200 mg by mouth 3 (three) times daily.    . fluticasone (FLONASE) 50 MCG/ACT nasal spray Place 1 spray into both nostrils daily.    . Multiple Vitamins-Minerals (MULTIVITAMIN PO) Take 1 tablet by mouth daily.    . norethindrone-ethinyl estradiol 1/35  (ORTHO-NOVUM, NORTREL,CYCLAFEM) tablet Take 1 tablet by mouth daily.    Marland Kitchen zolpidem (AMBIEN) 10 MG tablet Take 2.5 mg by mouth at bedtime as needed for sleep. Patient breaks tablet up into 4 and only takes 1/4 of a tablet when needed     No current facility-administered medications for this visit.    REVIEW OF SYSTEMS:   denotes positive finding,  denotes negative finding Cardiac  Comments:  Chest pain or chest pressure:    Shortness of breath upon exertion:    Short of breath when lying flat:    Irregular heart rhythm:        Vascular    Pain in calf, thigh, or hip brought on by ambulation: x Neurogenic   Pain in feet at night that wakes you up from your sleep:     Blood clot in your veins:    Leg swelling:         Pulmonary    Oxygen at home:    Productive cough:     Wheezing:         Neurologic    Sudden weakness in arms or legs:     Sudden numbness in arms or legs:     Sudden onset of difficulty speaking or slurred speech:    Temporary loss of vision in one eye:     Problems  with dizziness:         Gastrointestinal    Blood in stool:     Vomited blood:         Genitourinary    Burning when urinating:     Blood in urine:        Psychiatric    Major depression:         Hematologic    Bleeding problems:    Problems with blood clotting too easily:        Skin    Rashes or ulcers:        Constitutional    Fever or chills:      PHYSICAL EXAM: Filed Vitals:   11/18/15 1020  BP: 127/73  Pulse: 84  Temp: 97.3 F (36.3 C)  TempSrc: Oral  Resp: 18  Height: 5\' 2"  (1.575 m)  Weight: 136 lb 4.8 oz (61.825 kg)  SpO2: 99%    GENERAL: The patient is a well-nourished female, in no acute distress. The vital signs are documented above. CARDIAC: There is a regular rate and rhythm.  VASCULAR: Carotid arteries without bruits bilaterally. 2+ radial and 2+ dorsalis pedis pulses bilaterally PULMONARY: There is good air exchange bilaterally without wheezing or  rales. ABDOMEN: Soft and non-tender with normal pitched bowel sounds. No prior abdominal incisions MUSCULOSKELETAL: There are no major deformities or cyanosis. NEUROLOGIC: No focal weakness or paresthesias are detected. SKIN: There are no ulcers or rashes noted. PSYCHIATRIC: The patient has a normal affect.   MEDICAL ISSUES: Had long discussion with patient and her husband present. Explained my role for anterior exposure. Explained mobilization of the peritoneal contents, left ureter and arterial venous structures overlying the L5-S1 disc. Explain the potential for injury of all of these. Feel that she is excellent candidate for anterior approach. She has no evidence of atherosclerotic disease, is not obese and has had no prior abdominal surgery. She wished to proceed as planned with L5-S1 disc surgery   Michella Detjen [facility] Specialists of [facility] [place] [facility]: [phone]

## 2015-11-24 ENCOUNTER — Other Ambulatory Visit: Payer: Self-pay

## 2015-11-26 ENCOUNTER — Encounter (HOSPITAL_COMMUNITY)
Admission: RE | Admit: 2015-11-26 | Discharge: 2015-11-26 | Disposition: A | Discharge status: Home / Self Care | Payer: Medicare Other | Source: Ambulatory Visit | Attending: Neurological Surgery | Admitting: Neurological Surgery

## 2015-11-26 ENCOUNTER — Encounter (HOSPITAL_COMMUNITY): Payer: Self-pay

## 2015-11-26 DIAGNOSIS — E785 Hyperlipidemia, unspecified: Secondary | ICD-10-CM | POA: Diagnosis not present

## 2015-11-26 DIAGNOSIS — Z01812 Encounter for preprocedural laboratory examination: Secondary | ICD-10-CM | POA: Insufficient documentation

## 2015-11-26 DIAGNOSIS — Z01818 Encounter for other preprocedural examination: Secondary | ICD-10-CM | POA: Diagnosis present

## 2015-11-26 DIAGNOSIS — Z79899 Other long term (current) drug therapy: Secondary | ICD-10-CM | POA: Diagnosis not present

## 2015-11-26 DIAGNOSIS — Z0183 Encounter for blood typing: Secondary | ICD-10-CM | POA: Diagnosis not present

## 2015-11-26 HISTORY — DX: Restless legs syndrome: G25.81

## 2015-11-26 LAB — BASIC METABOLIC PANEL
ANION GAP: 14 (ref 5–15)
BUN: 18 mg/dL (ref 6–20)
CALCIUM: 9.4 mg/dL (ref 8.9–10.3)
CHLORIDE: 104 mmol/L (ref 101–111)
CO2: 21 mmol/L — AB (ref 22–32)
Creatinine, Ser: 0.66 mg/dL (ref 0.44–1.00)
GFR calc non Af Amer: >60 mL/min (ref >60)
GLUCOSE: 89 mg/dL (ref 65–99)
POTASSIUM: 3.9 mmol/L (ref 3.5–5.1)
Sodium: 139 mmol/L (ref 135–145)

## 2015-11-26 LAB — PROTIME-INR
INR: 0.95 (ref 0.00–1.49)
Prothrombin Time: 12.9 seconds (ref 11.6–15.2)

## 2015-11-26 LAB — SURGICAL PCR SCREEN
MRSA, PCR: NEGATIVE
Staphylococcus aureus: NEGATIVE

## 2015-11-26 LAB — CBC WITH DIFFERENTIAL/PLATELET
BASOS ABS: 0 10*3/uL (ref 0.0–0.1)
BASOS PCT: 0 %
Eosinophils Absolute: 0 10*3/uL (ref 0.0–0.7)
Eosinophils Relative: 0 %
HEMATOCRIT: 41.5 % (ref 36.0–46.0)
Hemoglobin: 13.9 g/dL (ref 12.0–15.0)
LYMPHS PCT: 19 %
Lymphs Abs: 3.7 10*3/uL (ref 0.7–4.0)
MCH: 30.1 pg (ref 26.0–34.0)
MCHC: 33.5 g/dL (ref 30.0–36.0)
MCV: 89.8 fL (ref 78.0–100.0)
MONO ABS: 0.8 10*3/uL (ref 0.1–1.0)
MONOS PCT: 4 %
NEUTROS ABS: 14.7 10*3/uL — AB (ref 1.7–7.7)
NEUTROS PCT: 77 %
Platelets: 377 10*3/uL (ref 150–400)
RBC: 4.62 MIL/uL (ref 3.87–5.11)
RDW: 13.5 % (ref 11.5–15.5)
WBC: 19.2 10*3/uL — ABNORMAL HIGH (ref 4.0–10.5)

## 2015-11-26 LAB — ABO/RH: ABO/RH(D): O POS

## 2015-11-26 LAB — TYPE AND SCREEN
ABO/RH(D): O POS
ANTIBODY SCREEN: NEGATIVE

## 2015-11-26 LAB — HCG, SERUM, QUALITATIVE: PREG SERUM: NEGATIVE

## 2015-11-26 NOTE — Pre-Procedure Instructions (Signed)
Angela Sims  11/26/2015      [facility] [id number] Karleen [place]- [place], [place] - [address] AT [facility] [place] [address] [place] [id number] Phone: [phone] Fax: [phone]  [facility] [id number] - [place], [place] - [address] AT [facility] [address] [place] [address] Phone: [phone] Fax: [phone]    Your procedure is scheduled on Fri, Mar 31 @ 7:30 AM  [facility] at 5:30 AM  Call this number if you have problems the morning of surgery:  [phone]   Remember:  Do not eat food or drink liquids after midnight.  Take these medicines the morning of surgery with A SIP OF WATER Albuterol<Bring Your Inhaler With You>,Baclofen(Lioresal),and Flonase(Fluticasone)              No Goody's,BC's,Aleve,Aspirin,Ibuprofen,Motrin,Advil,Fish Oil,or any Herbal Medications.    Do not wear jewelry, make-up or nail polish.  Do not wear lotions, powders, or perfumes.  You may wear deodorant.  Do not shave 48 hours prior to surgery.    Do not bring valuables to the hospital.  [facility] is not responsible for any belongings or valuables.  Contacts, dentures or bridgework may not be worn into surgery.  Leave your suitcase in the car.  After surgery it may be brought to your room.  For patients admitted to the hospital, discharge time will be determined by your treatment team.  Patients discharged the day of surgery will not be allowed to drive home.    Special instructions:  [place] - Preparing for Surgery  Before surgery, you can play an important role.  Because skin is not sterile, your skin needs to be as free of germs as possible.  You can reduce the number of germs on you skin by washing with CHG (chlorahexidine gluconate) soap before surgery.  CHG is an antiseptic cleaner which kills germs and bonds with the skin to continue killing germs even after washing.  Please DO NOT use if  you have an allergy to CHG or antibacterial soaps.  If your skin becomes reddened/irritated stop using the CHG and inform your nurse when you arrive at Short Stay.  Do not shave (including legs and underarms) for at least 48 hours prior to the first CHG shower.  You may shave your face.  Please follow these instructions carefully:   1.  Shower with CHG Soap the night before surgery and the                                morning of Surgery.  2.  If you choose to wash your hair, wash your hair first as usual with your       normal shampoo.  3.  After you shampoo, rinse your hair and body thoroughly to remove the                      Shampoo.  4.  Use CHG as you would any other liquid soap.  You can apply chg directly       to the skin and wash gently with scrungie or a clean washcloth.  5.  Apply the CHG Soap to your body ONLY FROM THE NECK DOWN.        Do not use on open wounds or open sores.  Avoid contact  with your eyes,       ears, mouth and genitals (private parts).  Wash genitals (private parts)       with your normal soap.  6.  Wash thoroughly, paying special attention to the area where your surgery        will be performed.  7.  Thoroughly rinse your body with warm water from the neck down.  8.  DO NOT shower/wash with your normal soap after using and rinsing off       the CHG Soap.  9.  Pat yourself dry with a clean towel.            10.  Wear clean pajamas.            11.  Place clean sheets on your bed the night of your first shower and do not        sleep with pets.  Day of Surgery  Do not apply any lotions/deoderants the morning of surgery.  Please wear clean clothes to the hospital/surgery center.    Please read over the following fact sheets that you were given. Pain Booklet, Coughing and Deep Breathing, Blood Transfusion Information, MRSA Information and Surgical Site Infection Prevention

## 2015-11-26 NOTE — Progress Notes (Signed)
Pt denies cardiac history, chest pain or sob. 

## 2015-11-27 NOTE — Progress Notes (Signed)
Anesthesia Chart Review:  Pt is a 45 year old female scheduled for L5-S1 anterior lumbar fusion, abdominal approach on 12/05/2015 with Dr. Yetta BarreJones and Dr. Arbie CookeyEarly.   PMH includes:  Hyperlipidemia. Never smoker. BMI 25.5. S/p lumbar laminectomy 06/25/15.   Medications include: albuterol  Preoperative labs reviewed.  WBC 19.2. Will recheck DOS.   Chest x-ray 06/19/15 reviewed. No active cardiopulmonary disease.   EKG 06/19/15: NSR.  Notified Erie NoeVanessa in Dr. Yetta BarreJones' office about elevated WBC count.   If labs acceptable DOS, I anticipate pt can proceed as scheduled.   Rica Mastngela Senna Lape, FNP-BC [facility]Anesthesiology Phone: [phone] 11/27/2015 11:42 AM

## 2015-12-01 ENCOUNTER — Other Ambulatory Visit: Payer: Self-pay

## 2015-12-02 DIAGNOSIS — M5020 Other cervical disc displacement, unspecified cervical region: Secondary | ICD-10-CM

## 2015-12-02 HISTORY — DX: Other cervical disc displacement, unspecified cervical region: M50.20

## 2015-12-04 NOTE — Anesthesia Preprocedure Evaluation (Addendum)
Anesthesia Evaluation  Patient identified by MRN, date of birth, ID band Patient awake    Reviewed: Allergy & Precautions, NPO status , Patient's Chart, lab work & pertinent test results  Airway Mallampati: II   Neck ROM: Full    Dental  (+) Teeth Intact, Dental Advisory Given   Pulmonary neg pulmonary ROS,    breath sounds clear to auscultation       Cardiovascular negative cardio ROS   Rhythm:Regular     Neuro/Psych negative neurological ROS  negative psych ROS   GI/Hepatic negative GI ROS, Neg liver ROS,   Endo/Other  negative endocrine ROS  Renal/GU negative Renal ROS  negative genitourinary   Musculoskeletal negative musculoskeletal ROS (+)   Abdominal   Peds negative pediatric ROS (+)  Hematology negative hematology ROS (+) 13/41   Anesthesia Other Findings   Reproductive/Obstetrics negative OB ROS                            Anesthesia Physical Anesthesia Plan  ASA: II  Anesthesia Plan: General   Post-op Pain Management:    Induction: Intravenous  Airway Management Planned: Oral ETT  Additional Equipment:   Intra-op Plan:   Post-operative Plan: Extubation in OR  Informed Consent: I have reviewed the patients History and Physical, chart, labs and discussed the procedure including the risks, benefits and alternatives for the proposed anesthesia with the patient or authorized representative who has indicated his/her understanding and acceptance.     Plan Discussed with:   Anesthesia Plan Comments: (2nd IV after induction)       Anesthesia Quick Evaluation

## 2015-12-05 ENCOUNTER — Inpatient Hospital Stay (HOSPITAL_COMMUNITY): Payer: No Typology Code available for payment source | Admitting: Emergency Medicine

## 2015-12-05 ENCOUNTER — Inpatient Hospital Stay (HOSPITAL_COMMUNITY): Payer: No Typology Code available for payment source | Admitting: Certified Registered Nurse Anesthetist

## 2015-12-05 ENCOUNTER — Inpatient Hospital Stay (HOSPITAL_COMMUNITY): Payer: No Typology Code available for payment source

## 2015-12-05 ENCOUNTER — Encounter (HOSPITAL_COMMUNITY)
Admission: RE | Disposition: A | Discharge status: Home Health | Payer: Self-pay | Source: Ambulatory Visit | Attending: Neurological Surgery

## 2015-12-05 ENCOUNTER — Inpatient Hospital Stay (HOSPITAL_COMMUNITY)
Admission: RE | Admit: 2015-12-05 | Discharge: 2015-12-06 | DRG: 460 | Disposition: A | Discharge status: Home Health | Payer: No Typology Code available for payment source | Source: Ambulatory Visit | Attending: Neurological Surgery | Admitting: Neurological Surgery

## 2015-12-05 ENCOUNTER — Encounter (HOSPITAL_COMMUNITY): Payer: Self-pay | Admitting: *Deleted

## 2015-12-05 DIAGNOSIS — E7800 Pure hypercholesterolemia, unspecified: Secondary | ICD-10-CM | POA: Diagnosis present

## 2015-12-05 DIAGNOSIS — Z9889 Other specified postprocedural states: Secondary | ICD-10-CM

## 2015-12-05 DIAGNOSIS — M549 Dorsalgia, unspecified: Secondary | ICD-10-CM | POA: Diagnosis present

## 2015-12-05 DIAGNOSIS — M797 Fibromyalgia: Secondary | ICD-10-CM | POA: Diagnosis present

## 2015-12-05 DIAGNOSIS — G2581 Restless legs syndrome: Secondary | ICD-10-CM | POA: Diagnosis present

## 2015-12-05 DIAGNOSIS — Z419 Encounter for procedure for purposes other than remedying health state, unspecified: Secondary | ICD-10-CM

## 2015-12-05 DIAGNOSIS — R112 Nausea with vomiting, unspecified: Secondary | ICD-10-CM

## 2015-12-05 DIAGNOSIS — Z981 Arthrodesis status: Secondary | ICD-10-CM

## 2015-12-05 DIAGNOSIS — M5137 Other intervertebral disc degeneration, lumbosacral region: Principal | ICD-10-CM | POA: Diagnosis present

## 2015-12-05 DIAGNOSIS — Z7951 Long term (current) use of inhaled steroids: Secondary | ICD-10-CM

## 2015-12-05 DIAGNOSIS — M961 Postlaminectomy syndrome, not elsewhere classified: Secondary | ICD-10-CM | POA: Diagnosis not present

## 2015-12-05 HISTORY — DX: Nausea with vomiting, unspecified: R11.2

## 2015-12-05 HISTORY — DX: Other specified postprocedural states: Z98.890

## 2015-12-05 HISTORY — PX: ABDOMINAL EXPOSURE: SHX5708

## 2015-12-05 HISTORY — DX: Other cervical disc displacement, unspecified cervical region: M50.20

## 2015-12-05 HISTORY — PX: ANTERIOR LUMBAR FUSION: SHX1170

## 2015-12-05 HISTORY — PX: LUMBAR FUSION: SHX111

## 2015-12-05 LAB — DIFFERENTIAL
BASOS PCT: 0 %
Basophils Absolute: 0 10*3/uL (ref 0.0–0.1)
EOS PCT: 0 %
Eosinophils Absolute: 0 10*3/uL (ref 0.0–0.7)
Lymphocytes Relative: 23 %
Lymphs Abs: 3.7 10*3/uL (ref 0.7–4.0)
MONO ABS: 1 10*3/uL (ref 0.1–1.0)
MONOS PCT: 6 %
NEUTROS PCT: 71 %
Neutro Abs: 11.3 10*3/uL — ABNORMAL HIGH (ref 1.7–7.7)

## 2015-12-05 LAB — CBC
HCT: 39.8 % (ref 36.0–46.0)
Hemoglobin: 13.3 g/dL (ref 12.0–15.0)
MCH: 29.8 pg (ref 26.0–34.0)
MCHC: 33.4 g/dL (ref 30.0–36.0)
MCV: 89.2 fL (ref 78.0–100.0)
PLATELETS: 368 10*3/uL (ref 150–400)
RBC: 4.46 MIL/uL (ref 3.87–5.11)
RDW: 13.5 % (ref 11.5–15.5)
WBC: 16.1 10*3/uL — AB (ref 4.0–10.5)

## 2015-12-05 SURGERY — ANTERIOR LUMBAR FUSION 1 LEVEL
Anesthesia: General | Site: Spine Lumbar

## 2015-12-05 MED ORDER — PHENOL 1.4 % MT LIQD: 1.0000 | OROMUCOSAL | Status: DC | PRN

## 2015-12-05 MED ORDER — DEXAMETHASONE SODIUM PHOSPHATE 10 MG/ML IJ SOLN
INTRAMUSCULAR | Status: AC
  Administered 2015-12-05: 10 mg via INTRAVENOUS
  Filled 2015-12-05: qty 1

## 2015-12-05 MED ORDER — SODIUM CHLORIDE 0.9 % IV SOLN: 250.0000 mL | INTRAVENOUS | Status: DC

## 2015-12-05 MED ORDER — FENTANYL CITRATE (PF) 100 MCG/2ML IJ SOLN
INTRAMUSCULAR | Status: AC
  Filled 2015-12-05: qty 2

## 2015-12-05 MED ORDER — LACTATED RINGERS IV SOLN
INTRAVENOUS | Status: DC | PRN
Start: 2015-12-05 — End: 2015-12-05
  Administered 2015-12-05: 08:00:00 via INTRAVENOUS

## 2015-12-05 MED ORDER — MIDAZOLAM HCL 5 MG/5ML IJ SOLN
INTRAMUSCULAR | Status: DC | PRN
  Administered 2015-12-05: 2 mg via INTRAVENOUS

## 2015-12-05 MED ORDER — DEXAMETHASONE SODIUM PHOSPHATE 10 MG/ML IJ SOLN: 10.0000 mg | INTRAMUSCULAR | Status: DC

## 2015-12-05 MED ORDER — OXYCODONE-ACETAMINOPHEN 5-325 MG PO TABS
1.0000 | ORAL_TABLET | ORAL | Status: DC | PRN
  Administered 2015-12-05 – 2015-12-06 (×6): 2 via ORAL
  Filled 2015-12-05 (×6): qty 2

## 2015-12-05 MED ORDER — CEFAZOLIN SODIUM 1-5 GM-% IV SOLN
1.0000 g | Freq: Three times a day (TID) | INTRAVENOUS | Status: AC
  Administered 2015-12-05 (×2): 1 g via INTRAVENOUS
  Filled 2015-12-05 (×2): qty 50

## 2015-12-05 MED ORDER — NEOSTIGMINE METHYLSULFATE 10 MG/10ML IV SOLN
INTRAVENOUS | Status: DC | PRN
  Administered 2015-12-05: 3 mg via INTRAVENOUS

## 2015-12-05 MED ORDER — CELECOXIB 200 MG PO CAPS
200.0000 mg | ORAL_CAPSULE | Freq: Three times a day (TID) | ORAL | Status: DC
  Administered 2015-12-05 – 2015-12-06 (×3): 200 mg via ORAL
  Filled 2015-12-05 (×3): qty 1

## 2015-12-05 MED ORDER — NEOSTIGMINE METHYLSULFATE 10 MG/10ML IV SOLN
INTRAVENOUS | Status: AC
  Filled 2015-12-05: qty 1

## 2015-12-05 MED ORDER — POTASSIUM CHLORIDE IN NACL 20-0.9 MEQ/L-% IV SOLN
INTRAVENOUS | Status: DC
Start: 2015-12-05 — End: 2015-12-06
  Administered 2015-12-05: 12:00:00 via INTRAVENOUS
  Filled 2015-12-05 (×3): qty 1000

## 2015-12-05 MED ORDER — ENOXAPARIN SODIUM 30 MG/0.3ML ~~LOC~~ SOLN
30.0000 mg | SUBCUTANEOUS | Status: DC
  Administered 2015-12-06: 30 mg via SUBCUTANEOUS
  Filled 2015-12-05: qty 0.3

## 2015-12-05 MED ORDER — FENTANYL CITRATE (PF) 100 MCG/2ML IJ SOLN
25.0000 ug | INTRAMUSCULAR | Status: DC | PRN
  Administered 2015-12-05 (×3): 50 ug via INTRAVENOUS

## 2015-12-05 MED ORDER — PANTOPRAZOLE SODIUM 40 MG IV SOLR: 40.0000 mg | Freq: Every day | INTRAVENOUS | Status: DC

## 2015-12-05 MED ORDER — PHENYLEPHRINE 40 MCG/ML (10ML) SYRINGE FOR IV PUSH (FOR BLOOD PRESSURE SUPPORT)
PREFILLED_SYRINGE | INTRAVENOUS | Status: AC
  Filled 2015-12-05: qty 10

## 2015-12-05 MED ORDER — ALBUTEROL SULFATE (2.5 MG/3ML) 0.083% IN NEBU: 2.5000 mg | INHALATION_SOLUTION | Freq: Four times a day (QID) | RESPIRATORY_TRACT | Status: DC | PRN

## 2015-12-05 MED ORDER — CHLORHEXIDINE GLUCONATE 4 % EX LIQD: 60.0000 mL | Freq: Once | CUTANEOUS | Status: DC

## 2015-12-05 MED ORDER — PANTOPRAZOLE SODIUM 40 MG PO TBEC
40.0000 mg | DELAYED_RELEASE_TABLET | Freq: Every day | ORAL | Status: DC
  Administered 2015-12-05 – 2015-12-06 (×2): 40 mg via ORAL
  Filled 2015-12-05 (×2): qty 1

## 2015-12-05 MED ORDER — ONDANSETRON HCL 4 MG/2ML IJ SOLN: 4.0000 mg | INTRAMUSCULAR | Status: DC | PRN

## 2015-12-05 MED ORDER — MEPERIDINE HCL 25 MG/ML IJ SOLN: 6.2500 mg | INTRAMUSCULAR | Status: DC | PRN

## 2015-12-05 MED ORDER — GLYCOPYRROLATE 0.2 MG/ML IJ SOLN
INTRAMUSCULAR | Status: AC
  Filled 2015-12-05: qty 2

## 2015-12-05 MED ORDER — SODIUM CHLORIDE 0.9% FLUSH: 3.0000 mL | INTRAVENOUS | Status: DC | PRN

## 2015-12-05 MED ORDER — FENTANYL CITRATE (PF) 100 MCG/2ML IJ SOLN
INTRAMUSCULAR | Status: DC | PRN
  Administered 2015-12-05 (×5): 50 ug via INTRAVENOUS

## 2015-12-05 MED ORDER — ROCURONIUM BROMIDE 50 MG/5ML IV SOLN
INTRAVENOUS | Status: AC
  Filled 2015-12-05: qty 1

## 2015-12-05 MED ORDER — ACETAMINOPHEN 10 MG/ML IV SOLN
INTRAVENOUS | Status: AC
  Administered 2015-12-05: 1000 mg via INTRAVENOUS
  Filled 2015-12-05: qty 100

## 2015-12-05 MED ORDER — LIDOCAINE HCL (CARDIAC) 20 MG/ML IV SOLN
INTRAVENOUS | Status: AC
  Filled 2015-12-05: qty 5

## 2015-12-05 MED ORDER — ACETAMINOPHEN 325 MG PO TABS: 650.0000 mg | ORAL_TABLET | ORAL | Status: DC | PRN

## 2015-12-05 MED ORDER — CEFAZOLIN SODIUM-DEXTROSE 2-4 GM/100ML-% IV SOLN
INTRAVENOUS | Status: AC
  Filled 2015-12-05: qty 100

## 2015-12-05 MED ORDER — MIDAZOLAM HCL 2 MG/2ML IJ SOLN
INTRAMUSCULAR | Status: AC
  Filled 2015-12-05: qty 2

## 2015-12-05 MED ORDER — LACTATED RINGERS IV SOLN
INTRAVENOUS | Status: DC | PRN
Start: 2015-12-05 — End: 2015-12-05
  Administered 2015-12-05: 07:00:00 via INTRAVENOUS

## 2015-12-05 MED ORDER — BACITRACIN 50000 UNITS IM SOLR
INTRAMUSCULAR | Status: DC | PRN
  Administered 2015-12-05: 500 mL

## 2015-12-05 MED ORDER — ACETAMINOPHEN 650 MG RE SUPP: 650.0000 mg | RECTAL | Status: DC | PRN

## 2015-12-05 MED ORDER — SCOPOLAMINE 1 MG/3DAYS TD PT72
MEDICATED_PATCH | TRANSDERMAL | Status: AC
  Filled 2015-12-05: qty 1

## 2015-12-05 MED ORDER — BACLOFEN 10 MG PO TABS
10.0000 mg | ORAL_TABLET | Freq: Three times a day (TID) | ORAL | Status: DC
  Administered 2015-12-05 – 2015-12-06 (×3): 10 mg via ORAL
  Filled 2015-12-05 (×3): qty 1

## 2015-12-05 MED ORDER — ONDANSETRON HCL 4 MG/2ML IJ SOLN
INTRAMUSCULAR | Status: AC
  Filled 2015-12-05: qty 2

## 2015-12-05 MED ORDER — PROPOFOL 10 MG/ML IV BOLUS
INTRAVENOUS | Status: DC | PRN
  Administered 2015-12-05: 160 mg via INTRAVENOUS

## 2015-12-05 MED ORDER — ALBUTEROL SULFATE HFA 108 (90 BASE) MCG/ACT IN AERS: 1.0000 | INHALATION_SPRAY | Freq: Four times a day (QID) | RESPIRATORY_TRACT | Status: DC | PRN

## 2015-12-05 MED ORDER — PROPOFOL 10 MG/ML IV BOLUS
INTRAVENOUS | Status: AC
  Filled 2015-12-05: qty 40

## 2015-12-05 MED ORDER — LIDOCAINE HCL (CARDIAC) 20 MG/ML IV SOLN
INTRAVENOUS | Status: DC | PRN
  Administered 2015-12-05: 100 mg via INTRAVENOUS

## 2015-12-05 MED ORDER — SCOPOLAMINE 1 MG/3DAYS TD PT72
1.0000 | MEDICATED_PATCH | TRANSDERMAL | Status: DC
  Administered 2015-12-05: 1.5 mg via TRANSDERMAL

## 2015-12-05 MED ORDER — SODIUM CHLORIDE 0.9% FLUSH
3.0000 mL | Freq: Two times a day (BID) | INTRAVENOUS | Status: DC
Start: 2015-12-05 — End: 2015-12-06
  Administered 2015-12-05 – 2015-12-06 (×2): 3 mL via INTRAVENOUS

## 2015-12-05 MED ORDER — ONDANSETRON HCL 4 MG/2ML IJ SOLN
4.0000 mg | Freq: Once | INTRAMUSCULAR | Status: AC
  Administered 2015-12-05: 4 mg via INTRAVENOUS

## 2015-12-05 MED ORDER — ONDANSETRON HCL 4 MG/2ML IJ SOLN
INTRAMUSCULAR | Status: AC
Start: 2015-12-05 — End: 2015-12-05
  Filled 2015-12-05: qty 2

## 2015-12-05 MED ORDER — ROCURONIUM BROMIDE 50 MG/5ML IV SOLN
INTRAVENOUS | Status: AC
  Filled 2015-12-05: qty 2

## 2015-12-05 MED ORDER — MENTHOL 3 MG MT LOZG: 1.0000 | LOZENGE | OROMUCOSAL | Status: DC | PRN

## 2015-12-05 MED ORDER — DEXTROSE 5 % IV SOLN
2.0000 g | INTRAVENOUS | Status: AC
  Administered 2015-12-05: 2 g via INTRAVENOUS
  Filled 2015-12-05: qty 20

## 2015-12-05 MED ORDER — 0.9 % SODIUM CHLORIDE (POUR BTL) OPTIME
TOPICAL | Status: DC | PRN
  Administered 2015-12-05: 1000 mL

## 2015-12-05 MED ORDER — ROCURONIUM BROMIDE 100 MG/10ML IV SOLN
INTRAVENOUS | Status: DC | PRN
  Administered 2015-12-05: 5 mg via INTRAVENOUS
  Administered 2015-12-05: 45 mg via INTRAVENOUS
  Administered 2015-12-05: 10 mg via INTRAVENOUS

## 2015-12-05 MED ORDER — THROMBIN 5000 UNITS EX SOLR
OROMUCOSAL | Status: DC | PRN
  Administered 2015-12-05: 5 mL via TOPICAL

## 2015-12-05 MED ORDER — SCOPOLAMINE 1 MG/3DAYS TD PT72
1.0000 | MEDICATED_PATCH | TRANSDERMAL | Status: DC
  Filled 2015-12-05: qty 1

## 2015-12-05 MED ORDER — ONDANSETRON HCL 4 MG/2ML IJ SOLN
INTRAMUSCULAR | Status: DC | PRN
  Administered 2015-12-05: 4 mg via INTRAVENOUS

## 2015-12-05 MED ORDER — NORETHINDRONE-ETH ESTRADIOL 1-35 MG-MCG PO TABS: 1.0000 | ORAL_TABLET | Freq: Every day | ORAL | Status: DC

## 2015-12-05 MED ORDER — FENTANYL CITRATE (PF) 250 MCG/5ML IJ SOLN
INTRAMUSCULAR | Status: AC
  Filled 2015-12-05: qty 5

## 2015-12-05 MED ORDER — PHENYLEPHRINE HCL 10 MG/ML IJ SOLN
INTRAMUSCULAR | Status: DC | PRN
  Administered 2015-12-05: 80 ug via INTRAVENOUS

## 2015-12-05 MED ORDER — GLYCOPYRROLATE 0.2 MG/ML IJ SOLN
INTRAMUSCULAR | Status: DC | PRN
  Administered 2015-12-05: 0.4 mg via INTRAVENOUS

## 2015-12-05 MED ORDER — MORPHINE SULFATE (PF) 2 MG/ML IV SOLN: 1.0000 mg | INTRAVENOUS | Status: DC | PRN

## 2015-12-05 SURGICAL SUPPLY — 105 items
APPLIER CLIP 11 MED OPEN (CLIP) ×3
BAG DECANTER FOR FLEXI CONT (MISCELLANEOUS) ×3 IMPLANT
BENZOIN TINCTURE PRP APPL 2/3 (GAUZE/BANDAGES/DRESSINGS) IMPLANT
BONE MATRIX OSTEOCEL PRO MED (Bone Implant) ×3 IMPLANT
BUR EGG ELITE 5.0 (BURR) ×3 IMPLANT
BUR MATCHSTICK NEURO 3.0 LAGG (BURR) IMPLANT
CAGE COROENT SLR 12X34X24-8 (Cage) ×3 IMPLANT
CANISTER SUCT 3000ML PPV (MISCELLANEOUS) ×3 IMPLANT
CLIP APPLIE 11 MED OPEN (CLIP) ×2 IMPLANT
COVER BACK TABLE 24X17X13 BIG (DRAPES) IMPLANT
DERMABOND ADVANCED (GAUZE/BANDAGES/DRESSINGS) ×1
DERMABOND ADVANCED .7 DNX12 (GAUZE/BANDAGES/DRESSINGS) ×2 IMPLANT
DRAPE C-ARM 42X72 X-RAY (DRAPES) ×3 IMPLANT
DRAPE INCISE IOBAN 66X45 STRL (DRAPES) IMPLANT
DRAPE LAPAROTOMY 100X72X124 (DRAPES) ×3 IMPLANT
DRAPE POUCH INSTRU U-SHP 10X18 (DRAPES) ×3 IMPLANT
DURAPREP 26ML APPLICATOR (WOUND CARE) ×3 IMPLANT
ELECT BLADE 4.0 EZ CLEAN MEGAD (MISCELLANEOUS) ×3
ELECT REM PT RETURN 9FT ADLT (ELECTROSURGICAL) ×3
ELECTRODE BLDE 4.0 EZ CLN MEGD (MISCELLANEOUS) ×2 IMPLANT
ELECTRODE REM PT RTRN 9FT ADLT (ELECTROSURGICAL) ×2 IMPLANT
GAUZE SPONGE 4X4 12PLY STRL (GAUZE/BANDAGES/DRESSINGS) IMPLANT
GAUZE SPONGE 4X4 16PLY XRAY LF (GAUZE/BANDAGES/DRESSINGS) IMPLANT
GLOVE BIO SURGEON STRL SZ 6.5 (GLOVE) IMPLANT
GLOVE BIO SURGEON STRL SZ7 (GLOVE) IMPLANT
GLOVE BIO SURGEON STRL SZ8 (GLOVE) ×6 IMPLANT
GLOVE BIO SURGEON STRL SZ8.5 (GLOVE) IMPLANT
GLOVE BIOGEL PI IND STRL 7.0 (GLOVE) ×4 IMPLANT
GLOVE BIOGEL PI IND STRL 8 (GLOVE) ×4 IMPLANT
GLOVE BIOGEL PI INDICATOR 7.0 (GLOVE) ×2
GLOVE BIOGEL PI INDICATOR 8 (GLOVE) ×2
GLOVE ECLIPSE 6.5 STRL STRAW (GLOVE) IMPLANT
GLOVE ECLIPSE 7.0 STRL STRAW (GLOVE) IMPLANT
GLOVE ECLIPSE 7.5 STRL STRAW (GLOVE) ×9 IMPLANT
GLOVE INDICATOR 6.5 STRL GRN (GLOVE) IMPLANT
GLOVE INDICATOR 7.0 STRL GRN (GLOVE) IMPLANT
GLOVE INDICATOR 8.0 STRL GRN (GLOVE) IMPLANT
GLOVE OPTIFIT SS 6.5 STRL BRWN (GLOVE) IMPLANT
GLOVE OPTIFIT SS 7.5 STRL LX (GLOVE) IMPLANT
GLOVE OPTIFIT SS 8.0 STRL (GLOVE) IMPLANT
GLOVE OPTIFIT SS 8.5 STRL (GLOVE) IMPLANT
GLOVE SS BIOGEL STRL SZ 7.5 (GLOVE) ×2 IMPLANT
GLOVE SS N UNI LF 7.5 STRL (GLOVE) IMPLANT
GLOVE SUPERSENSE BIOGEL SZ 7.5 (GLOVE) ×1
GLOVE SURG SS PI 6.5 STRL IVOR (GLOVE) ×12 IMPLANT
GLOVE SURG SS PI 7.0 STRL IVOR (GLOVE) IMPLANT
GLOVE SURG SS PI 7.5 STRL IVOR (GLOVE) IMPLANT
GLOVE SURG SS PI 8.0 STRL IVOR (GLOVE) IMPLANT
GLOVE SURG SS PI 8.5 STRL IVOR (GLOVE)
GLOVE SURG SS PI 8.5 STRL STRW (GLOVE) IMPLANT
GOWN STRL NON-REIN LRG LVL3 (GOWN DISPOSABLE) IMPLANT
GOWN STRL REUS W/ TWL LRG LVL3 (GOWN DISPOSABLE) ×2 IMPLANT
GOWN STRL REUS W/ TWL XL LVL3 (GOWN DISPOSABLE) ×6 IMPLANT
GOWN STRL REUS W/TWL 2XL LVL3 (GOWN DISPOSABLE) ×3 IMPLANT
GOWN STRL REUS W/TWL LRG LVL3 (GOWN DISPOSABLE) ×1
GOWN STRL REUS W/TWL XL LVL3 (GOWN DISPOSABLE) ×3
HEMOSTAT POWDER KIT SURGIFOAM (HEMOSTASIS) ×3 IMPLANT
INSERT FOGARTY 61MM (MISCELLANEOUS) IMPLANT
INSERT FOGARTY SM (MISCELLANEOUS) IMPLANT
KIT BASIN OR (CUSTOM PROCEDURE TRAY) ×3 IMPLANT
KIT ROOM TURNOVER OR (KITS) ×3 IMPLANT
LIQUID BAND (GAUZE/BANDAGES/DRESSINGS) IMPLANT
LOOP VESSEL MAXI BLUE (MISCELLANEOUS) IMPLANT
LOOP VESSEL MINI RED (MISCELLANEOUS) IMPLANT
MARKER SKIN DUAL TIP RULER LAB (MISCELLANEOUS) ×3 IMPLANT
NEEDLE SPNL 18GX3.5 QUINCKE PK (NEEDLE) ×3 IMPLANT
NS IRRIG 1000ML POUR BTL (IV SOLUTION) ×3 IMPLANT
PACK LAMINECTOMY NEURO (CUSTOM PROCEDURE TRAY) ×3 IMPLANT
PAD ARMBOARD 7.5X6 YLW CONV (MISCELLANEOUS) IMPLANT
PIN FIX PLATE BRIGADE 12MM THD (Pin) ×3 IMPLANT
PLATE BRIGADE LORDOTIC 38MM (Plate) ×3 IMPLANT
SCREW BRIGADE 5X5X25.0MM (Screw) ×12 IMPLANT
SPONGE INTESTINAL PEANUT (DISPOSABLE) ×9 IMPLANT
SPONGE LAP 18X18 X RAY DECT (DISPOSABLE) ×3 IMPLANT
SPONGE LAP 4X18 X RAY DECT (DISPOSABLE) IMPLANT
STAPLER VISISTAT 35W (STAPLE) IMPLANT
STRIP CLOSURE SKIN 1/2X4 (GAUZE/BANDAGES/DRESSINGS) IMPLANT
SUT MNCRL AB 4-0 PS2 18 (SUTURE) IMPLANT
SUT PROLENE 4 0 RB 1 (SUTURE)
SUT PROLENE 4-0 RB1 .5 CRCL 36 (SUTURE) IMPLANT
SUT PROLENE 5 0 CC1 (SUTURE) IMPLANT
SUT PROLENE 6 0 C 1 30 (SUTURE) IMPLANT
SUT PROLENE 6 0 CC (SUTURE) IMPLANT
SUT SILK 0 TIES 10X30 (SUTURE) IMPLANT
SUT SILK 2 0 TIES 10X30 (SUTURE) IMPLANT
SUT SILK 2 0SH CR/8 30 (SUTURE) IMPLANT
SUT SILK 3 0 TIES 10X30 (SUTURE) ×3 IMPLANT
SUT SILK 3 0 TIES 17X18 (SUTURE)
SUT SILK 3 0SH CR/8 30 (SUTURE) IMPLANT
SUT SILK 3-0 18XBRD TIE BLK (SUTURE) IMPLANT
SUT VIC AB 0 CT1 18XCR BRD8 (SUTURE) IMPLANT
SUT VIC AB 0 CT1 27 (SUTURE) ×2
SUT VIC AB 0 CT1 27XBRD ANBCTR (SUTURE) ×4 IMPLANT
SUT VIC AB 0 CT1 8-18 (SUTURE)
SUT VIC AB 2-0 CP2 18 (SUTURE) ×3 IMPLANT
SUT VIC AB 2-0 CT1 36 (SUTURE) IMPLANT
SUT VIC AB 3-0 SH 27 (SUTURE)
SUT VIC AB 3-0 SH 27X BRD (SUTURE) IMPLANT
SUT VIC AB 3-0 SH 8-18 (SUTURE) ×6 IMPLANT
SUT VICRYL 4-0 PS2 18IN ABS (SUTURE) IMPLANT
TOWEL OR 17X24 6PK STRL BLUE (TOWEL DISPOSABLE) ×6 IMPLANT
TOWEL OR 17X26 10 PK STRL BLUE (TOWEL DISPOSABLE) ×3 IMPLANT
TRAP SPECIMEN MUCOUS 40CC (MISCELLANEOUS) ×3 IMPLANT
TRAY FOLEY W/METER SILVER 14FR (SET/KITS/TRAYS/PACK) ×3 IMPLANT
WATER STERILE IRR 1000ML POUR (IV SOLUTION) ×3 IMPLANT

## 2015-12-05 NOTE — Addendum Note (Signed)
Addendum  created 12/05/15 1203 by Fabian NovemberJoshua Saloman Adalene Gulotta, CRNA   Modules edited: Anesthesia Attestations

## 2015-12-05 NOTE — Anesthesia Postprocedure Evaluation (Signed)
Anesthesia Post Note  Patient: Angela Sims  Procedure(s) Performed: Procedure(s) (LRB): ANTERIOR LUMBAR FUSION LUMBAR FIVE-SACRAL ONE  (N/A) ABDOMINAL EXPOSURE (N/A)  Patient location during evaluation: PACU Anesthesia Type: General Level of consciousness: awake and alert Pain management: pain level controlled Vital Signs Assessment: post-procedure vital signs reviewed and stable Respiratory status: spontaneous breathing, nonlabored ventilation, respiratory function stable and patient connected to nasal cannula oxygen Cardiovascular status: blood pressure returned to baseline and stable Postop Assessment: no signs of nausea or vomiting Anesthetic complications: no    Last Vitals:  Filed Vitals:   12/05/15 1042 12/05/15 1045  BP:  119/69  Pulse: 72 68  Temp: 36.2 C   Resp: 16 17    Last Pain:  Filed Vitals:   12/05/15 1106  PainSc: 0-No pain                 Sebastian Acheheodore Levorn Oleski

## 2015-12-05 NOTE — H&P (Signed)
Subjective: Patient is a 45 y.o. female admitted for ALIF. Onset of symptoms was several months ago, gradually worsening since that time.  The pain is rated severe, and is located at the across the lower back and radiates to legs. The pain is described as aching and occurs all day. The symptoms have been progressive. Symptoms are exacerbated by exercise. MRI or CT showed DDD L5-S1.   Past Medical History  Diagnosis Date  . High cholesterol   . Fibromyalgia   . Myofascial pain syndrome   . Seasonal allergies   . Deviated septum     right side  . Restless leg syndrome   . 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 No past surgeries    . Wisdom tooth extraction    . Lumbar laminectomy/decompression microdiscectomy Right 06/25/2015    Procedure: LUMBAR LAMINECTOMY/DECOMPRESSION MICRODISCECTOMY 1 LEVEL; RIGHT ;  Surgeon: Tia Alert, MD;  Location: [facility];  Service: Neurosurgery;  Laterality: Right;    Prior to Admission medications   Medication Sig Start Date End Date Taking? Authorizing Provider  albuterol (PROVENTIL HFA;VENTOLIN HFA) 108 (90 BASE) MCG/ACT inhaler Inhale 1-2 puffs into the lungs every 6 (six) hours as needed for wheezing or shortness of breath.   Yes Historical Provider, MD  baclofen (LIORESAL) 10 MG tablet Take 10 mg by mouth 3 (three) times daily.  10/30/15  Yes Historical Provider, MD  celecoxib (CELEBREX) 200 MG capsule Take 200 mg by mouth 3 (three) times daily.   Yes Historical Provider, MD  fluticasone (FLONASE) 50 MCG/ACT nasal spray Place 1 spray into both nostrils daily.   Yes Historical Provider, MD  Multiple Vitamins-Minerals (MULTIVITAMIN PO) Take 1 tablet by mouth daily.   Yes Historical Provider, MD  norethindrone-ethinyl estradiol 1/35 (ORTHO-NOVUM, NORTREL,CYCLAFEM) tablet Take 1 tablet by mouth at bedtime.    Yes Historical Provider, MD  zolpidem (AMBIEN) 10 MG tablet Take 2.5 mg by mouth at bedtime as  needed for sleep. Patient breaks tablet up into 4 and only takes 1/4 of a tablet when needed   Yes Historical Provider, MD   No Known Allergies  Social History  Substance Use Topics  . Smoking status: Never Smoker   . Smokeless tobacco: Never Used  . Alcohol Use: Yes     Comment: 1 or 2 drinks when going out    Family History  Problem Relation Age of Onset  . Bladder Cancer Mother   . Alcohol abuse Father      Review of Systems  Positive ROS: neg  All other systems have been reviewed and were otherwise negative with the exception of those mentioned in the HPI and as above.  Objective: Vital signs in last 24 hours: Temp:  [99.1 F (37.3 C)] 99.1 F (37.3 C) (03/31 0614) Pulse Rate:  [85] 85 (03/31 0614) Resp:  [16] 16 (03/31 0614) BP: (135)/(79) 135/79 mmHg (03/31 0614) SpO2:  [99 %] 99 % (03/31 0614)  General Appearance: Alert, cooperative, no distress, appears stated age Head: Normocephalic, without obvious abnormality, atraumatic Eyes: PERRL, conjunctiva/corneas clear, EOM's intact    Neck: Supple, symmetrical, trachea midline Back: Symmetric, no curvature, ROM normal, no CVA tenderness Lungs:  respirations unlabored Heart: Regular rate and rhythm Abdomen: Soft, non-tender Extremities: Extremities normal, atraumatic, no cyanosis or edema Pulses: 2+ and symmetric all extremities Skin: Skin color, texture, turgor normal, no rashes or lesions  NEUROLOGIC:   Mental status: Alert and oriented x4,  no aphasia, good attention span, fund of knowledge, and memory Motor Exam - grossly normal Sensory Exam - grossly normal Reflexes: 1+ Coordination - grossly normal Gait - grossly normal Balance - grossly normal Cranial Nerves: I: smell Not tested  II: visual acuity  OS: nl    OD: nl  II: visual fields Full to confrontation  II: pupils Equal, round, reactive to light  III,VII: ptosis None  III,IV,VI: extraocular muscles  Full ROM  V: mastication Normal  V: facial  light touch sensation  Normal  V,VII: corneal reflex  Present  VII: facial muscle function - upper  Normal  VII: facial muscle function - lower Normal  VIII: hearing Not tested  IX: soft palate elevation  Normal  IX,X: gag reflex Present  XI: trapezius strength  5/5  XI: sternocleidomastoid strength 5/5  XI: neck flexion strength  5/5  XII: tongue strength  Normal    Data Review Lab Results  Component Value Date   WBC 19.2* 11/26/2015   HGB 13.9 11/26/2015   HCT 41.5 11/26/2015   MCV 89.8 11/26/2015   PLT 377 11/26/2015   Lab Results  Component Value Date   NA 139 11/26/2015   K 3.9 11/26/2015   CL 104 11/26/2015   CO2 21* 11/26/2015   BUN 18 11/26/2015   CREATININE 0.66 11/26/2015   GLUCOSE 89 11/26/2015   Lab Results  Component Value Date   INR 0.95 11/26/2015    Assessment/Plan: Patient admitted for ALIF L5-S1. Patient has failed a reasonable attempt at conservative therapy.  I explained the condition and procedure to the patient and answered any questions.  Patient wishes to proceed with procedure as planned. Understands risks/ benefits and typical outcomes of procedure.   Angela Sims S 12/05/2015 6:18 AM

## 2015-12-05 NOTE — Transfer of Care (Signed)
Immediate Anesthesia Transfer of Care Note  Patient: Angela Sims  Procedure(s) Performed: Procedure(s) with comments: ANTERIOR LUMBAR FUSION LUMBAR FIVE-SACRAL ONE  (N/A) - Abdominal approach ABDOMINAL EXPOSURE (N/A)  Patient Location: PACU  Anesthesia Type:General  Level of Consciousness: awake, alert , oriented and patient cooperative  Airway & Oxygen Therapy: Patient Spontanous Breathing and Patient connected to nasal cannula oxygen  Post-op Assessment: Report given to RN, Post -op Vital signs reviewed and stable and Patient moving all extremities X 4  Post vital signs: Reviewed and stable  Last Vitals:  Filed Vitals:   12/05/15 0614  BP: 135/79  Pulse: 85  Temp: 37.3 C  Resp: 16    Complications: No apparent anesthesia complications

## 2015-12-05 NOTE — Anesthesia Procedure Notes (Addendum)
Procedure Name: Intubation Date/Time: 12/05/2015 7:40 AM Performed by: Fabian NovemberSOLHEIM, Vaunda Gutterman SALOMAN Pre-anesthesia Checklist: Patient identified, Patient being monitored, Timeout performed, Emergency Drugs available and Suction available Patient Re-evaluated:Patient Re-evaluated prior to inductionOxygen Delivery Method: Circle System Utilized Preoxygenation: Pre-oxygenation with 100% oxygen Intubation Type: IV induction Ventilation: Mask ventilation without difficulty Laryngoscope Size: Miller and 3 Grade View: Grade I Tube type: Oral Tube size: 7.5 mm Number of attempts: 1 Airway Equipment and Method: Stylet Placement Confirmation: ETT inserted through vocal cords under direct vision,  positive ETCO2 and breath sounds checked- equal and bilateral Secured at: 22 cm Tube secured with: Tape Dental Injury: Teeth and Oropharynx as per pre-operative assessment  Comments: Pt c/o history of neck pain. Prior to induction pt instructed to place her head in comfortable position. Minimal movement of head and neck during DL and intubation.

## 2015-12-05 NOTE — Op Note (Signed)
    OPERATIVE REPORT  DATE OF SURGERY: 12/05/2015  PATIENT: Angela Sims, 45 y.o. female MRN: [id number]  DOB: [birth year]  PRE-OPERATIVE DIAGNOSIS: L5-S1 degenerative disc disease  POST-OPERATIVE DIAGNOSIS:  Same  PROCEDURE: Anterior exposure for L5 5 S1 disc surgery  SURGEON:  Gretta Beganodd Shmuel Girgis, M.D.  Co-surgeon for the procedure Dr. Marikay Alaravid Jones  ANESTHESIA:  Gen.  EBL: 50 ml  Total I/O In: 1600 [I.V.:1600] Out: 100 [Urine:50; Blood:50]  BLOOD ADMINISTERED: None  DRAINS: None  SPECIMEN: None  COUNTS CORRECT:  YES  PLAN OF CARE: PACU   PATIENT DISPOSITION:  PACU - hemodynamically stable  PROCEDURE DETAILS: The patient was taken to the operating placed supine position the area of the abdomen was visualized with lateral C-arm projection. This revealed the level of the L5-S1 disc. The abdomen was prepped and draped in usual sterile fashion. An incision was made from the midline transversely to the left in line with the area of the L5-S1 disc. The incision was continued with electrocautery through the subcutaneous fat down to the level of the rectus sheath. The anterior rectus sheath was exposed and was opened in line with skin incision with electrocautery. The rectus muscle was mobilized circumferentially. The risks peroneal space was entered and the left lower quadrant and blunt dissection was used to mobilize the intraperitoneal contents to the right. The peritoneum was not entered. The left ureter was identified and was mobilized to the left blunt dissection over the L5-S1 disc was used to mobilize the iliac arteries and veins for exposure to the L5-S1 disc. The middle sacral vessels were clipped with ligaclips and divided. The Thompson retractor was brought onto the field. The reverse lip 100 blades were positioned to the right and left of the L5-S1 disc. The 140 malleable blades were positioned for superior and inferior exposure. C-arm was brought back onto the field and this  showed this was the L5-S1 level. The remainder the procedure will be dictated as a separate note with Dr. Jordan LikesJones   Jorje Vanatta, M.D. 12/05/2015 2:18 PM

## 2015-12-05 NOTE — Care Management Note (Signed)
Case Management Note  Patient Details  Name: Angela Sims MRN: [id number] Date of Birth: [birth year]  Subjective/Objective:                    Action/Plan: Patient was admitted for an ALIF. Lives at home with spouse. Will follow for discharge needs pending PT/OT evals and physician orders.  Expected Discharge Date:                  Expected Discharge Plan:     In-House Referral:     Discharge planning Services     Post Acute Care Choice:    Choice offered to:     DME Arranged:    DME Agency:     HH Arranged:    HH Agency:     Status of Service:  In process, will continue to follow  Medicare Important Message Given:    Date Medicare IM Given:    Medicare IM give by:    Date Additional Medicare IM Given:    Additional Medicare Important Message give by:     If discussed at Long Length of Stay Meetings, dates discussed:    Additional Comments:  Anda KraftRobarge, Corneilus Heggie C, RN 12/05/2015, 11:27 AM [phone]

## 2015-12-05 NOTE — Op Note (Signed)
12/05/2015  9:36 AM  PATIENT:  Angela Sims  45 y.o. female  PRE-OPERATIVE DIAGNOSIS:  Failed back syndrome, DDD L5-S1, back and R leg pain  POST-OPERATIVE DIAGNOSIS:  same  PROCEDURE:  1. Anterior lumbar interbody fusion L5-S1 utilizing a peek interbody cage packed with local autograft and morcellized allograft, separate anterior lumbar plating L5-S1 utilizing a nuvasive plate  SURGEON:  Marikay Alaravid Vidyuth Belsito, MD  Co-surgeon: Dr. Tawanna Coolerodd early  ANESTHESIA:   General  EBL: 50 ml  Total I/O In: 1000 [I.V.:1000] Out: 50 [Blood:50]  BLOOD ADMINISTERED:none  DRAINS: None   SPECIMEN:  No Specimen  INDICATION FOR PROCEDURE: This patient underwent a microdiscectomy at L5-S1 on the right in the past. She presented with severe degenerative disc disease and back pain. She tried medical management without relief. I recommended anterior lumbar interbody fusion. Patient understood the risks, benefits, and alternatives and potential outcomes and wished to proceed.  PROCEDURE DETAILS: The patient was taken to the operating room and after induction of adequate generalized endotracheal anesthesia she was placed in the supine position on the operating room table. Her abdomen was cleaned and then prepped with DuraPrep and then draped in usual sterile fashion. The exposure was performed by vascular surgeon Dr. early and that will be dictated in a separate operative report. Once the retractor was in place we placed a needle into the midline at L5-S1 and confirmed our level and the midline with AP and lateral fluoroscopy. Annulus was incised and initial discectomy was done with pituitary rongeurs. The annulus was released from the endplates with a Cobb elevator. Then used a high-speed drill to drill the endplates to prepare them for arthrodesis. The drill shavings were saved and mucus trap for later arthrodesis. The 10 mm trial was tried first and this was felt to be somewhat too small. We went to the 12 mm trial  that fit perfectly. Therefore we used a 12 mm x 8 peek interbody cage and packed this with morcellized allograft and the saved drill shavings and tapped this into position at L5-S1 utilizing lateral fluoroscopy. We then placed a plate over the anterior lumbar spine at L5-S1 and check displacement with lateral fluoroscopy. A pin was placed to help the plate staying in position and then we used an awl under lateral fluoroscopy to a depth of 25 mm, and then placed 25 mm screws into the L5 vertebral body and the sacrum. These locked into the plate. We then checked our construct with AP lateral fluoroscopy. We irrigated with sterile solution. Removed the retractors and made sure there was no bleeding. We did a AP abdominal film to rule out retained instruments or sponges. Then closed the fascia with running 0 Vicryl. I closed subcutaneous tissue with 2-0 Vicryl. I closed the subcuticular tissue with 3-0 Vicryl. The skin was closed with Dermabond. The drapes were removed and the patient was awakened from general anesthesia and transferred to the recovery room in stable condition. At the end of the procedure all sponge and needle and instrument counts were correct.  PLAN OF CARE: Admit to inpatient   PATIENT DISPOSITION:  PACU - hemodynamically stable.   Delay start of Pharmacological VTE agent (>24hrs) due to surgical blood loss or risk of bleeding:  yes

## 2015-12-05 NOTE — Evaluation (Signed)
Physical Therapy Evaluation Patient Details Name: Angela RanaGinger L Menor MRN: [id number] DOB: July 03, 1971 Today's Date: 12/05/2015   History of Present Illness  45 y.o. female admitted for ALIF  Clinical Impression  Patient demonstrates deficits in functional mobility as indicated below. Will need continued skilled PT to address deficits and maximize function. Will see as indicated and progress as tolerated.    Follow Up Recommendations No PT follow up;Supervision for mobility/OOB    Equipment Recommendations       Recommendations for Other Services       Precautions / Restrictions Precautions Precautions: Back;Fall Precaution Comments: verbally reviewed back precautions with patient Required Braces or Orthoses: Spinal Brace Spinal Brace: Lumbar corset;Applied in sitting position Restrictions Weight Bearing Restrictions: No      Mobility  Bed Mobility Overal bed mobility: Needs Assistance Bed Mobility: Rolling;Sidelying to Sit Rolling: Min guard Sidelying to sit: Min guard       General bed mobility comments: increased time to perform, VCs for positioning and technique, no physical assist required  Transfers Overall transfer level: Needs assistance Equipment used: Rolling walker (2 wheeled) Transfers: Sit to/from Stand Sit to Stand: Min guard         General transfer comment: Vcs for hand placement and technique Re: precautions  Ambulation/Gait Ambulation/Gait assistance: Min guard Ambulation Distance (Feet): 120 Feet Assistive device: None (RW for ~5010ft) Gait Pattern/deviations: Step-through pattern;Decreased stride length Gait velocity: decreased   General Gait Details: initially slow and guarded with gait, VCs for increased cadence and relaxation during mobility. Utilized RW for about 10 ft then able to defer to no assistive device  Stairs            Wheelchair Mobility    Modified Rankin (Stroke Patients Only)       Balance                                             Pertinent Vitals/Pain Pain Assessment: 0-10 (initially denied any pain) Pain Score: 4  Pain Location: incision site (anterior back)  Pain Descriptors / Indicators: Aching;Sore;Guarding Pain Intervention(s): Monitored during session;Repositioned;Relaxation    Home Living Family/patient expects to be discharged to:: Private residence Living Arrangements: Spouse/significant other;Children Available Help at Discharge: Family;Available PRN/intermittently Type of Home: House Home Access: Stairs to enter   [employer] CorporationEntrance Stairs-Number of Steps: 3 Home Layout: One level Home Equipment: Shower seat - built in Additional Comments: Husband and patient work from home normally    Prior Function Level of Independence: Independent               Hand Dominance   Dominant Hand: Right    Extremity/Trunk Assessment   Upper Extremity Assessment: RUE deficits/detail RUE Deficits / Details: Pt reports heriniated disc at cervical region, numbness and tingling in thumb all the time    RUE Sensation: decreased light touch     Lower Extremity Assessment: Defer to PT evaluation      Cervical / Trunk Assessment: Normal  Communication   Communication: No difficulties  Cognition Arousal/Alertness: Awake/alert Behavior During Therapy: WFL for tasks assessed/performed Overall Cognitive Status: Within Functional Limits for tasks assessed                      General Comments      Exercises        Assessment/Plan    PT Assessment Patient needs  continued PT services  PT Diagnosis Difficulty walking;Acute pain   PT Problem List Decreased activity tolerance;Decreased balance;Decreased mobility;Decreased safety awareness;Decreased knowledge of precautions;Pain  PT Treatment Interventions DME instruction;Stair training;Gait training;Functional mobility training;Therapeutic activities;Therapeutic exercise;Balance training;Patient/family  education   PT Goals (Current goals can be found in the Care Plan section) Acute Rehab PT Goals Patient Stated Goal: to go home PT Goal Formulation: With patient/family Time For Goal Achievement: 12/19/15 Potential to Achieve Goals: Good    Frequency Min 5X/week   Barriers to discharge        Co-evaluation               End of Session Equipment Utilized During Treatment: Gait belt;Back brace Activity Tolerance: No increased pain Patient left: in chair;with call bell/phone within reach;with family/visitor present Nurse Communication: Mobility status         Time: 1610-9604 PT Time Calculation (min) (ACUTE ONLY): 23 min   Charges:   PT Evaluation $PT Eval Moderate Complexity: 1 Procedure     PT G CodesFabio Asa Dec 17, 2015, 4:10 PM Charlotte Crumb, PT DPT  [phone]

## 2015-12-05 NOTE — Evaluation (Signed)
Occupational Therapy Evaluation Patient Details Name: Angela Sims MRN: [id number] DOB: 07-27-1971 Today's Date: 12/05/2015    History of Present Illness 45 y.o. female admitted for ALIF   Clinical Impression   Patient presenting with decreased ADL and functional mobility independence secondary to above. Patient independent PTA. Patient currently functioning at an overall supervision to min assist level. Patient will benefit from acute OT to increase overall independence in the areas of ADLs, functional mobility, and overall safety in order to safely discharge home with intermittent assistance.     Follow Up Recommendations  No OT follow up;Supervision - Intermittent    Equipment Recommendations  3 in 1 bedside comode;Other (comment) (AE)    Recommendations for Other Services  None at this time    Precautions / Restrictions Precautions Precautions: Back;Fall Precaution Comments: verbally reviewed back precautions with patient Required Braces or Orthoses: Spinal Brace Spinal Brace: Lumbar corset;Applied in sitting position Restrictions Weight Bearing Restrictions: No     Mobility Bed Mobility Overal bed mobility: Needs Assistance Bed Mobility: Rolling;Sidelying to Sit Rolling: Min guard Sidelying to sit: Min guard       General bed mobility comments: increased time to perform, VCs for positioning and technique, no physical assist required  Transfers Overall transfer level: Needs assistance Equipment used: Rolling walker (2 wheeled);None Transfers: Sit to/from Stand Sit to Stand: Min guard         General transfer comment: Vcs for hand placement and technique Re: precautions. Pt stood from EOB with RW, from [facility] with no AD.     Balance Overall balance assessment: Needs assistance Sitting-balance support: No upper extremity supported;Feet supported Sitting balance-Leahy Scale: Good     Standing balance support: No upper extremity supported;During functional  activity Standing balance-Leahy Scale: Fair Standing balance comment: min guard for safety with no UE support     ADL Overall ADL's : Needs assistance/impaired Eating/Feeding: Set up;Sitting   Grooming: Set up;Sitting   Upper Body Bathing: Set up;Supervision/ safety;Sitting   Lower Body Bathing: Moderate assistance;Sit to/from stand Lower Body Bathing Details (indicate cue type and reason): can benefit from AE Upper Body Dressing : Minimal assistance;Sitting Upper Body Dressing Details (indicate cue type and reason): Assistance for back brace needed  Lower Body Dressing: Moderate assistance;Sit to/from stand Lower Body Dressing Details (indicate cue type and reason): can benefit from AE Toilet Transfer: Minimal assistance;Ambulation;BSC   Toileting- Clothing Manipulation and Hygiene: Moderate assistance;Sit to/from stand Toileting - Clothing Manipulation Details (indicate cue type and reason): can benefit from AE or more education on how to perform this without breaking back precautions    Tub/Shower Transfer Details (indicate cue type and reason): did not occur        Vision Vision Assessment?: No apparent visual deficits          Pertinent Vitals/Pain Pain Assessment: 0-10 (initially denied any pain) Pain Score: 4  Pain Location: incision site (anterior back)  Pain Descriptors / Indicators: Aching;Sore;Guarding Pain Intervention(s): Monitored during session;Repositioned;Relaxation     Hand Dominance Right   Extremity/Trunk Assessment Upper Extremity Assessment Upper Extremity Assessment: RUE deficits/detail RUE Deficits / Details: Pt reports heriniated disc at cervical region, numbness and tingling in thumb all the time  RUE Sensation: decreased light touch RUE Coordination:  [facility])   Lower Extremity Assessment Lower Extremity Assessment: Defer to PT evaluation   Cervical / Trunk Assessment Cervical / Trunk Assessment: Normal   Communication  Communication Communication: No difficulties   Cognition Arousal/Alertness: Awake/alert Behavior During Therapy: [facility] for  tasks assessed/performed Overall Cognitive Status: Within Functional Limits for tasks assessed              Home Living Family/patient expects to be discharged to:: Private residence Living Arrangements: Spouse/significant other;Children Available Help at Discharge: Family;Available PRN/intermittently Type of Home: House Home Access: Stairs to enter [employer]Entrance Stairs-Number of Steps: 3   Home Layout: One level     Bathroom Shower/Tub: Producer, television/film/videoWalk-in shower   Bathroom Toilet: Standard     Home Equipment: Shower seat - built in   Additional Comments: Husband and patient work from home normally      Prior Functioning/Environment Level of Independence: Independent     OT Diagnosis: Generalized weakness;Acute pain   OT Problem List: Decreased strength;Decreased activity tolerance;Impaired balance (sitting and/or standing);Decreased safety awareness;Decreased knowledge of use of DME or AE;Decreased knowledge of precautions;Pain   OT Treatment/Interventions: Self-care/ADL training;Energy conservation;DME and/or AE instruction;Therapeutic activities;Patient/family education;Balance training    OT Goals(Current goals can be found in the care plan section) Acute Rehab OT Goals Patient Stated Goal: to go home OT Goal Formulation: With patient/family Time For Goal Achievement: 12/12/15 Potential to Achieve Goals: Good ADL Goals Pt Will Perform Grooming: with modified independence;standing Pt Will Perform Lower Body Bathing: with modified independence;sit to/from stand;with adaptive equipment Pt Will Perform Lower Body Dressing: with modified independence;sit to/from stand;with adaptive equipment Pt Will Transfer to Toilet: with modified independence;bedside commode;ambulating Pt Will Perform Tub/Shower Transfer: Shower transfer;ambulating;3 in 1;with modified  independence Additional ADL Goal #1: Pt will independently verbalize and adhere to back precautions 100% of the time during ADL and functional mobility   OT Frequency: Min 2X/week   Barriers to D/C: none known at this time   End of Session Equipment Utilized During Treatment: Gait belt;Back brace Nurse Communication: Mobility status  Activity Tolerance: Patient tolerated treatment well Patient left: in chair;with call bell/phone within reach;with family/visitor present   Time: 1517-1540 OT Time Calculation (min): 23 min Charges:  OT General Charges $OT Visit: 1 Procedure OT Evaluation $OT Eval Low Complexity: 1 Procedure  Edwin CapPatricia Mariene Dickerman , MS, OTR/L, CLT Pager: [phone]  12/05/2015, 4:12 PM

## 2015-12-06 MED ORDER — ENOXAPARIN SODIUM 30 MG/0.3ML ~~LOC~~ SOLN: 30.0000 mg | SUBCUTANEOUS | Status: DC

## 2015-12-06 MED ORDER — OXYCODONE-ACETAMINOPHEN 5-325 MG PO TABS
1.0000 | ORAL_TABLET | ORAL | Status: DC | PRN
Start: 2015-12-06 — End: 2016-01-22

## 2015-12-06 NOTE — Discharge Summary (Signed)
Physician Discharge Summary  Patient ID: Angela RanaGinger L Michie MRN: [id number] DOB/AGE: [birth year] 47 y.o.  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  Consults: vascular surgery  Significant Diagnostic Studies: None  Treatments: surgery: anterior lumbar interbody fusion L 5 S 1 level  Discharge Exam: Blood pressure 99/60, pulse 64, temperature 98.4 F (36.9 C), temperature source Oral, resp. rate 18, last menstrual period 11/10/2015, SpO2 99 %. Neurologic: Alert and oriented X 3, normal strength and tone. Normal symmetric reflexes. Normal coordination and gait Wound:CDI  Disposition: Home     Medication List    TAKE these medications        albuterol 108 (90 Base) MCG/ACT inhaler  Commonly known as:  PROVENTIL HFA;VENTOLIN HFA  Inhale 1-2 puffs into the lungs every 6 (six) hours as needed for wheezing or shortness of breath.     baclofen 10 MG tablet  Commonly known as:  LIORESAL  Take 10 mg by mouth 3 (three) times daily.     celecoxib 200 MG capsule  Commonly known as:  CELEBREX  Take 200 mg by mouth 3 (three) times daily.     enoxaparin 30 MG/0.3ML injection  Commonly known as:  LOVENOX  Inject 0.3 mLs (30 mg total) into the skin daily.     fluticasone 50 MCG/ACT nasal spray  Commonly known as:  FLONASE  Place 1 spray into both nostrils daily.     MULTIVITAMIN PO  Take 1 tablet by mouth daily.     norethindrone-ethinyl estradiol 1/35 tablet  Commonly known as:  ORTHO-NOVUM, NORTREL,CYCLAFEM  Take 1 tablet by mouth at bedtime.     oxyCODONE-acetaminophen 5-325 MG tablet  Commonly known  as:  PERCOCET/ROXICET  Take 1-2 tablets by mouth every 4 (four) hours as needed for moderate pain.     zolpidem 10 MG tablet  Commonly known as:  AMBIEN  Take 2.5 mg by mouth at bedtime as needed for sleep. Patient breaks tablet up into 4 and only takes 1/4 of a tablet when needed         Signed: Dorian HeckleSTERN,Dalynn Jhaveri D, MD 12/06/2015, 7:26 AM

## 2015-12-06 NOTE — Progress Notes (Signed)
Subjective: Patient reports doing well  Objective: Vital signs in last 24 hours: Temp:  [97.2 F (36.2 C)-98.7 F (37.1 C)] 98.4 F (36.9 C) (04/01 0521) Pulse Rate:  [64-79] 64 (04/01 0521) Resp:  [13-29] 18 (04/01 0521) BP: (99-130)/(52-69) 99/60 mmHg (04/01 0521) SpO2:  [93 %-100 %] 99 % (04/01 0521)  Intake/Output from previous day: 03/31 0701 - 04/01 0700 In: 1600 [I.V.:1600] Out: 100 [Urine:50; Blood:50] Intake/Output this shift:    Physical Exam: Strength full.  Dressing CDI. Active bowel sounds.  Lab Results:  Recent Labs  12/05/15 0620  WBC 16.1*  HGB 13.3  HCT 39.8  PLT 368   BMET No results for input(s): NA, K, CL, CO2, GLUCOSE, BUN, CREATININE, CALCIUM in the last 72 hours.  Studies/Results: Dg Lumbar Spine 2-3 Views  12/05/2015  CLINICAL DATA:  L5-S1 anterior fusion EXAM: LUMBAR SPINE - 2-3 VIEW COMPARISON:  MRI 10/02/2015 FINDINGS: 2 intraoperative spot images demonstrate changes of anterior fusion at L5-S1. No hardware or bony complicating feature. Normal alignment. IMPRESSION: Anterior fusion L5-S1 without visible complicating feature. Electronically Signed   By: Charlett NoseKevin  Dover M.D.   On: 12/05/2015 09:34   Dg C-arm 1-60 Min  12/05/2015  CLINICAL DATA:  L5-S1 ALIF EXAM: DG C-ARM 61-120 MIN COMPARISON:  None. FINDINGS: Two intraoperative spot fluoro images are obtained. Initial view at 0911 hours shows lateral projection of the lumbosacral junction with interbody spacer and anterior plate and screw fixation device. Retractors are identified anteriorly. Second image at 0913 hours shows the L5-S1 anterior plate. IMPRESSION: Intraoperative localization. Electronically Signed   By: Kennith CenterEric  Mansell M.D.   On: 12/05/2015 09:58   Dg Or Local Abdomen  12/05/2015  CLINICAL DATA:  Abdominal surgery with an accurate instrument count EXAM: OR LOCAL ABDOMEN COMPARISON:  None. FINDINGS: There is postoperative change at L5 and S1. There is no retained instrument or needle.  Bowel gas pattern unremarkable. IMPRESSION: Bowel gas pattern unremarkable.  No retained instrument or needle. Electronically Signed   By: Bretta BangWilliam  Woodruff III M.D.   On: 12/05/2015 09:32    Assessment/Plan: Patient doing well.  Discharge home.  Lovenox daily for one week.    LOS: 1 day    Dorian Sims,Angela Rion D, MD 12/06/2015, 7:24 AM

## 2015-12-06 NOTE — Progress Notes (Signed)
Occupational Therapy Treatment/Discharge Patient Details Name: Angela Sims MRN: [id number] DOB: 09-Feb-1971 Today's Date: 12/06/2015    History of present illness 45 y.o. female admitted for ALIF   OT comments  Pt progressing very well towards occupational therapy goals. Pt completed all LB ADLs and mobility at supervision-mod I level. Educated pt on back precautions, brace wear protocol, positioning for sleep, compensatory strategies for ADLs including AE, pain/edema management, to gradually increase activity level and fall prevention strategies. All education has been completed and pt has no further questions. Pt with no further acute OT needs. OT signing off.   Follow Up Recommendations  No OT follow up;Supervision - Intermittent    Equipment Recommendations  3 in 1 bedside comode;Other (comment) (Adaptive equipment- pt to purchase)    Recommendations for Other Services      Precautions / Restrictions Precautions Precautions: Back;Fall Precaution Booklet Issued: Yes (comment) Precaution Comments: Pt recalled 2/3 back precautions, provided and reviewed handout Required Braces or Orthoses: Spinal Brace Spinal Brace: Lumbar corset;Applied in sitting position Restrictions Weight Bearing Restrictions: No       Mobility Bed Mobility Overal bed mobility: Modified Independent Bed Mobility: Rolling;Sidelying to Sit Rolling: Modified independent (Device/Increase time) Sidelying to sit: Modified independent (Device/Increase time)       General bed mobility comments: HOB flat, no use of bedrails, exited bed on R side to simulate home environment. Good demonstration of log roll technique without cues.  Transfers Overall transfer level: Modified independent Equipment used: None Transfers: Sit to/from Stand Sit to Stand: Modified independent (Device/Increase time)         General transfer comment: No physical assist required. Good demonstration of safety strategies.     Balance Overall balance assessment: Needs assistance Sitting-balance support: No upper extremity supported;Feet supported Sitting balance-Leahy Scale: Good     Standing balance support: No upper extremity supported;During functional activity Standing balance-Leahy Scale: Good                     ADL Overall ADL's : Needs assistance/impaired     Grooming: Wash/dry hands;Standing;Supervision/safety   Upper Body Bathing: Supervision/ safety;Sitting   Lower Body Bathing: Supervison/ safety;Sit to/from stand;With adaptive equipment Lower Body Bathing Details (indicate cue type and reason): able to cross ankle-over-knee, also reviewed use of long-handled sponge Upper Body Dressing : Supervision/safety;Sitting   Lower Body Dressing: Supervision/safety;Sit to/from stand;Cueing for compensatory techniques;With adaptive equipment Lower Body Dressing Details (indicate cue type and reason): Able to cross ankle-over-knee, reviewed use of reacher and sock aid Toilet Transfer: Supervision/safety;Ambulation;BSC   Toileting- Water quality scientist and Hygiene: Supervision/safety;Sit to/from stand   Tub/ Shower Transfer: Walk-in shower;Cueing for safety;Cueing for sequencing;Ambulation;Supervision/safety Tub/Shower Transfer Details (indicate cue type and reason): Cues for proper step sequence, advised pt to have husband present for supervision Functional mobility during ADLs: Supervision/safety General ADL Comments: Reviewed back precautions, brace wear protocol, positioning for sleep, compensatory strategies for LB ADLs and practiced with AE, pain/edema management, and fall prevention strategies.      Vision                     Perception     Praxis      Cognition   Behavior During Therapy: [facility] for tasks assessed/performed Overall Cognitive Status: Within Functional Limits for tasks assessed       Memory: Decreased recall of precautions                Extremity/Trunk Assessment  Exercises     Shoulder Instructions       General Comments      Pertinent Vitals/ Pain       Pain Assessment: 0-10 Pain Score: 4  Pain Location: back Pain Descriptors / Indicators: Sore Pain Intervention(s): Limited activity within patient's tolerance;Monitored during session;Premedicated before session;Repositioned  Home Living                                          Prior Functioning/Environment              Frequency       Progress Toward Goals  OT Goals(current goals can now be found in the care plan section)  Progress towards OT goals: Goals met/education completed, patient discharged from OT  Acute Rehab OT Goals Patient Stated Goal: to go home OT Goal Formulation: With patient/family Time For Goal Achievement: 12/12/15 Potential to Achieve Goals: Good ADL Goals Pt Will Perform Grooming: with modified independence;standing Pt Will Perform Lower Body Bathing: with modified independence;sit to/from stand;with adaptive equipment Pt Will Perform Lower Body Dressing: with modified independence;sit to/from stand;with adaptive equipment Pt Will Transfer to Toilet: with modified independence;bedside commode;ambulating Pt Will Perform Tub/Shower Transfer: Shower transfer;ambulating;3 in 1;with modified independence Additional ADL Goal #1: Pt will independently verbalize and adhere to back precautions 100% of the time during ADL and functional mobility   Plan All goals met and education completed, patient discharged from OT services    Co-evaluation                 End of Session Equipment Utilized During Treatment: Gait belt;Back brace   Activity Tolerance Patient tolerated treatment well   Patient Left in chair;with call bell/phone within reach   Nurse Communication Mobility status        Time: 2992-4268 OT Time Calculation (min): 21 min  Charges: OT General Charges $OT Visit:  1 Procedure OT Treatments $Self Care/Home Management : 8-22 mins  Redmond Baseman, OTR/L Pager: [phone] 12/06/2015, 10:37 AM

## 2015-12-06 NOTE — Progress Notes (Signed)
Physical Therapy Treatment Patient Details Name: Angela RanaGinger L Sims MRN: [id number] DOB: 1970/09/20 Today's Date: 12/06/2015    History of Present Illness 45 y.o. female admitted for ALIF    PT Comments    Pt able to manage stairs with S.  Anticipated d/c from hospital today.  Follow Up Recommendations  No PT follow up;Supervision for mobility/OOB     Equipment Recommendations  3in1 (PT)    Recommendations for Other Services       Precautions / Restrictions Precautions Precautions: Back;Fall Precaution Comments: Pt recalled all back precautions Required Braces or Orthoses: Spinal Brace Spinal Brace: Lumbar corset;Applied in sitting position Restrictions Weight Bearing Restrictions: No    Mobility  Bed Mobility               General bed mobility comments: up  in chair upon arrival  Transfers Overall transfer level: Modified independent   Transfers: Sit to/from Stand Sit to Stand: Modified independent (Device/Increase time)            Ambulation/Gait Ambulation/Gait assistance: Modified independent (Device/Increase time) Ambulation Distance (Feet): 175 Feet Assistive device: None Gait Pattern/deviations: Decreased stride length Gait velocity: decreased   General Gait Details: guarded, but not MOD I with no AD   Stairs Stairs: Yes Stairs assistance: Supervision Stair Management: One rail Left;Alternating pattern;Step to pattern Number of Stairs: 3 (x 2) General stair comments: Step to pattern with ascent/descent then step through pattern with ascent and step to with descent  Wheelchair Mobility    Modified Rankin (Stroke Patients Only)       Balance     Sitting balance-Leahy Scale: Good       Standing balance-Leahy Scale: Fair                      Cognition Arousal/Alertness: Awake/alert Behavior During Therapy: WFL for tasks assessed/performed Overall Cognitive Status: Within Functional Limits for tasks assessed                       Exercises      General Comments General comments (skin integrity, edema, etc.): Answered all pt and husband's questions.      Pertinent Vitals/Pain Pain Assessment: 0-10 Pain Score: 2  Pain Location: back Pain Descriptors / Indicators: Operative site guarding Pain Intervention(s): Monitored during session    Home Living                      Prior Function            PT Goals (current goals can now be found in the care plan section) Acute Rehab PT Goals Patient Stated Goal: to go home PT Goal Formulation: With patient/family Time For Goal Achievement: 12/19/15 Potential to Achieve Goals: Good Progress towards PT goals: Progressing toward goals    Frequency  Min 5X/week    PT Plan Current plan remains appropriate    Co-evaluation             End of Session Equipment Utilized During Treatment: Back brace Activity Tolerance: No increased pain Patient left: with family/visitor present     Time: 2952-84131003-1012 PT Time Calculation (min) (ACUTE ONLY): 9 min  Charges:  $Gait Training: 8-22 mins                    G Codes:      Angela Sims 12/06/2015, 10:23 AM

## 2015-12-06 NOTE — Progress Notes (Signed)
Patient ID: Angela Sims, female   DOB: 1970/11/14, 45 y.o.   MRN: [id number] Setting up in chair. Very comfortable. Has walked without difficulty. Reports mild to moderate abdominal and back pain. Feet well-perfused bilaterally. For discharge home today per neurosurgery

## 2015-12-06 NOTE — Progress Notes (Signed)
Pt discharge education and instructions completed with pt and spouse at bedside; both voices understanding; denies any questions. Pt IV removed; abd incision remains clean, dry and intact with no drainage or active bleeding noted. Pt discharge home with spouse to transport her home. Pt home DME delivered to pt at bedside; pt handed her prescription for percocet and lovenox; lovenox education completed with pt and handed out information as well provided to pt. Pt transported off unit via wheelchair with belongings and spouse to the side. Dionne BucyP. Amo Shawanda Sievert RN

## 2015-12-06 NOTE — Progress Notes (Signed)
CM received call from DME and recommended HHPT/OT. No follow up PT/OT recommended or ordered.  CM called [facility] DME rep, Trey PaulaJeff to please deliver rolling walker and 3n1 to room so pt can discharge.  No other CM needs were communicated.

## 2015-12-08 ENCOUNTER — Encounter (HOSPITAL_COMMUNITY): Payer: Self-pay | Admitting: Neurological Surgery

## 2015-12-22 ENCOUNTER — Other Ambulatory Visit: Payer: Self-pay | Admitting: Neurological Surgery

## 2015-12-22 DIAGNOSIS — M5412 Radiculopathy, cervical region: Secondary | ICD-10-CM | POA: Insufficient documentation

## 2016-01-12 NOTE — Pre-Procedure Instructions (Signed)
    Madlyn L Milham  01/12/2016   Your procedure is scheduled on Thursday, May 18.  [facility] at 5:30 AM                Your surgery or procedure is scheduled for 7:30 AM   Call this number if you have problems the morning of surgery:[phone]                  For any other questions, please call [phone], Monday - Friday 8 AM - 4 PM.   Remember:  Do not eat food or drink liquids after midnight Wednesday, May 17  Take these medicines the morning of surgery with A SIP OF WATER : Balcofen.  May use Flonase nasal spray.              Use Albuterol Inhaler if needed.  May take Oxycoden- Acetaminophen if needed.                     Thursday, May 11, stop taking Celebrex.  DO not take any Aspirin, Aspirin Products, Naproxen (aleve), Ibuprofen (Advil), Herbal mediations or Vitamins.   Do not wear jewelry, make-up or nail polish.  Do not wear lotions, powders, or perfumes.   Do not shave 48 hours prior to surgery.    Do not bring valuables to the hospital.  [facility] any belongings or valuables.  Contacts, dentures or bridgework may not be worn into surgery.  Leave your suitcase in the car.  After surgery it may be brought to your room.  For patients admitted to the hospital, discharge time will be determined by your treatment team.  Patients discharged the day of surgery will not be allowed to drive home.   Special instructions:  Review  [facility] - Preparing For Surgery.   Please read over the following fact sheets that you were given. MRSA Information

## 2016-01-13 ENCOUNTER — Encounter (HOSPITAL_COMMUNITY): Payer: Self-pay

## 2016-01-13 ENCOUNTER — Encounter (HOSPITAL_COMMUNITY)
Admission: RE | Admit: 2016-01-13 | Discharge: 2016-01-13 | Disposition: A | Discharge status: Home / Self Care | Payer: Medicare Other | Source: Ambulatory Visit | Attending: Neurological Surgery | Admitting: Neurological Surgery

## 2016-01-13 DIAGNOSIS — Z01812 Encounter for preprocedural laboratory examination: Secondary | ICD-10-CM | POA: Diagnosis present

## 2016-01-13 DIAGNOSIS — M5412 Radiculopathy, cervical region: Secondary | ICD-10-CM | POA: Insufficient documentation

## 2016-01-13 HISTORY — DX: Adverse effect of unspecified anesthetic, initial encounter: T41.45XA

## 2016-01-13 HISTORY — DX: Nausea with vomiting, unspecified: R11.2

## 2016-01-13 HISTORY — DX: Other complications of anesthesia, initial encounter: T88.59XA

## 2016-01-13 HISTORY — DX: Other specified postprocedural states: Z98.890

## 2016-01-13 LAB — CBC
HCT: 38.4 % (ref 36.0–46.0)
HEMOGLOBIN: 12.2 g/dL (ref 12.0–15.0)
MCH: 28.8 pg (ref 26.0–34.0)
MCHC: 31.8 g/dL (ref 30.0–36.0)
MCV: 90.6 fL (ref 78.0–100.0)
Platelets: 361 10*3/uL (ref 150–400)
RBC: 4.24 MIL/uL (ref 3.87–5.11)
RDW: 13.6 % (ref 11.5–15.5)
WBC: 14.4 10*3/uL — ABNORMAL HIGH (ref 4.0–10.5)

## 2016-01-13 LAB — SURGICAL PCR SCREEN
MRSA, PCR: NEGATIVE
Staphylococcus aureus: NEGATIVE

## 2016-01-13 LAB — HCG, SERUM, QUALITATIVE: Preg, Serum: NEGATIVE

## 2016-01-21 MED ORDER — CEFAZOLIN SODIUM-DEXTROSE 2-4 GM/100ML-% IV SOLN
2.0000 g | INTRAVENOUS | Status: AC
  Administered 2016-01-22: 2 g via INTRAVENOUS
  Filled 2016-01-21: qty 100

## 2016-01-22 ENCOUNTER — Ambulatory Visit (HOSPITAL_COMMUNITY): Payer: Medicare Other | Admitting: Anesthesiology

## 2016-01-22 ENCOUNTER — Encounter (HOSPITAL_COMMUNITY)
Admission: RE | Disposition: A | Discharge status: Home / Self Care | Payer: Self-pay | Source: Ambulatory Visit | Attending: Neurological Surgery

## 2016-01-22 ENCOUNTER — Encounter (HOSPITAL_COMMUNITY): Payer: Self-pay | Admitting: *Deleted

## 2016-01-22 ENCOUNTER — Ambulatory Visit (HOSPITAL_COMMUNITY): Payer: Medicare Other

## 2016-01-22 ENCOUNTER — Ambulatory Visit (HOSPITAL_COMMUNITY)
Admission: RE | Admit: 2016-01-22 | Discharge: 2016-01-22 | Disposition: A | Discharge status: Home / Self Care | Payer: Medicare Other | Source: Ambulatory Visit | Attending: Neurological Surgery | Admitting: Neurological Surgery

## 2016-01-22 DIAGNOSIS — Z7951 Long term (current) use of inhaled steroids: Secondary | ICD-10-CM | POA: Diagnosis not present

## 2016-01-22 DIAGNOSIS — M4802 Spinal stenosis, cervical region: Secondary | ICD-10-CM | POA: Insufficient documentation

## 2016-01-22 DIAGNOSIS — M542 Cervicalgia: Secondary | ICD-10-CM

## 2016-01-22 DIAGNOSIS — M50222 Other cervical disc displacement at C5-C6 level: Secondary | ICD-10-CM | POA: Insufficient documentation

## 2016-01-22 DIAGNOSIS — Z79899 Other long term (current) drug therapy: Secondary | ICD-10-CM | POA: Diagnosis not present

## 2016-01-22 DIAGNOSIS — Z793 Long term (current) use of hormonal contraceptives: Secondary | ICD-10-CM | POA: Diagnosis not present

## 2016-01-22 DIAGNOSIS — M47812 Spondylosis without myelopathy or radiculopathy, cervical region: Secondary | ICD-10-CM | POA: Diagnosis not present

## 2016-01-22 DIAGNOSIS — M5416 Radiculopathy, lumbar region: Secondary | ICD-10-CM | POA: Diagnosis present

## 2016-01-22 HISTORY — PX: CERVICAL DISC ARTHROPLASTY: SHX587

## 2016-01-22 SURGERY — CERVICAL ANTERIOR DISC ARTHROPLASTY
Anesthesia: General | Site: Neck

## 2016-01-22 MED ORDER — ROCURONIUM BROMIDE 100 MG/10ML IV SOLN
INTRAVENOUS | Status: DC | PRN
  Administered 2016-01-22: 50 mg via INTRAVENOUS

## 2016-01-22 MED ORDER — SODIUM CHLORIDE 0.9 % IJ SOLN
INTRAMUSCULAR | Status: AC
  Filled 2016-01-22: qty 10

## 2016-01-22 MED ORDER — PHENOL 1.4 % MT LIQD: 1.0000 | OROMUCOSAL | Status: DC | PRN

## 2016-01-22 MED ORDER — OXYCODONE HCL 5 MG/5ML PO SOLN: 5.0000 mg | Freq: Once | ORAL | Status: AC | PRN

## 2016-01-22 MED ORDER — BUPIVACAINE HCL (PF) 0.25 % IJ SOLN
INTRAMUSCULAR | Status: DC | PRN
  Administered 2016-01-22: 2 mL

## 2016-01-22 MED ORDER — SCOPOLAMINE 1 MG/3DAYS TD PT72
MEDICATED_PATCH | TRANSDERMAL | Status: AC
  Administered 2016-01-22: 1 via TRANSDERMAL
  Filled 2016-01-22: qty 1

## 2016-01-22 MED ORDER — GLYCOPYRROLATE 0.2 MG/ML IV SOSY
PREFILLED_SYRINGE | INTRAVENOUS | Status: DC | PRN
  Administered 2016-01-22: .3 mg via INTRAVENOUS

## 2016-01-22 MED ORDER — ALBUTEROL SULFATE (2.5 MG/3ML) 0.083% IN NEBU: 3.0000 mL | INHALATION_SOLUTION | Freq: Every day | RESPIRATORY_TRACT | Status: DC | PRN

## 2016-01-22 MED ORDER — MORPHINE SULFATE (PF) 2 MG/ML IV SOLN: 1.0000 mg | INTRAVENOUS | Status: DC | PRN

## 2016-01-22 MED ORDER — FLUTICASONE PROPIONATE 50 MCG/ACT NA SUSP
1.0000 | Freq: Every day | NASAL | Status: DC
  Filled 2016-01-22: qty 16

## 2016-01-22 MED ORDER — ONDANSETRON HCL 4 MG/2ML IJ SOLN
INTRAMUSCULAR | Status: DC | PRN
  Administered 2016-01-22: 4 mg via INTRAVENOUS

## 2016-01-22 MED ORDER — NEOSTIGMINE METHYLSULFATE 5 MG/5ML IV SOSY
PREFILLED_SYRINGE | INTRAVENOUS | Status: AC
  Filled 2016-01-22: qty 5

## 2016-01-22 MED ORDER — MIDAZOLAM HCL 2 MG/2ML IJ SOLN
INTRAMUSCULAR | Status: AC
  Filled 2016-01-22: qty 2

## 2016-01-22 MED ORDER — OXYCODONE HCL 5 MG PO TABS
5.0000 mg | ORAL_TABLET | Freq: Once | ORAL | Status: AC | PRN
  Administered 2016-01-22: 5 mg via ORAL

## 2016-01-22 MED ORDER — ONDANSETRON HCL 4 MG/2ML IJ SOLN: 4.0000 mg | Freq: Once | INTRAMUSCULAR | Status: DC | PRN

## 2016-01-22 MED ORDER — LIDOCAINE 2% (20 MG/ML) 5 ML SYRINGE
INTRAMUSCULAR | Status: DC | PRN
  Administered 2016-01-22: 100 mg via INTRAVENOUS

## 2016-01-22 MED ORDER — PROPOFOL 10 MG/ML IV BOLUS
INTRAVENOUS | Status: DC | PRN
Start: 2016-01-22 — End: 2016-01-22
  Administered 2016-01-22: 200 mg via INTRAVENOUS

## 2016-01-22 MED ORDER — BACLOFEN 10 MG PO TABS
10.0000 mg | ORAL_TABLET | Freq: Three times a day (TID) | ORAL | Status: DC
  Administered 2016-01-22: 10 mg via ORAL
  Filled 2016-01-22 (×2): qty 1

## 2016-01-22 MED ORDER — SUFENTANIL CITRATE 50 MCG/ML IV SOLN
INTRAVENOUS | Status: AC
  Filled 2016-01-22: qty 1

## 2016-01-22 MED ORDER — SODIUM CHLORIDE 0.9% FLUSH: 3.0000 mL | Freq: Two times a day (BID) | INTRAVENOUS | Status: DC

## 2016-01-22 MED ORDER — ONDANSETRON HCL 4 MG/2ML IJ SOLN: 4.0000 mg | INTRAMUSCULAR | Status: DC | PRN

## 2016-01-22 MED ORDER — SODIUM CHLORIDE 0.9 % IV SOLN: 250.0000 mL | INTRAVENOUS | Status: DC

## 2016-01-22 MED ORDER — SURGIFOAM 100 EX MISC
CUTANEOUS | Status: DC | PRN
  Administered 2016-01-22: 20 mL via TOPICAL

## 2016-01-22 MED ORDER — 0.9 % SODIUM CHLORIDE (POUR BTL) OPTIME
TOPICAL | Status: DC | PRN
  Administered 2016-01-22: 1000 mL

## 2016-01-22 MED ORDER — POTASSIUM CHLORIDE IN NACL 20-0.9 MEQ/L-% IV SOLN
INTRAVENOUS | Status: DC
  Filled 2016-01-22 (×2): qty 1000

## 2016-01-22 MED ORDER — OXYCODONE HCL 5 MG PO TABS
ORAL_TABLET | ORAL | Status: AC
  Filled 2016-01-22: qty 1

## 2016-01-22 MED ORDER — PROPOFOL 10 MG/ML IV BOLUS
INTRAVENOUS | Status: AC
  Filled 2016-01-22: qty 20

## 2016-01-22 MED ORDER — SODIUM CHLORIDE 0.9% FLUSH
3.0000 mL | INTRAVENOUS | Status: DC | PRN
Start: 2016-01-22 — End: 2016-01-22

## 2016-01-22 MED ORDER — ROCURONIUM BROMIDE 50 MG/5ML IV SOLN
INTRAVENOUS | Status: AC
  Filled 2016-01-22: qty 1

## 2016-01-22 MED ORDER — CEFAZOLIN SODIUM 1-5 GM-% IV SOLN
1.0000 g | Freq: Three times a day (TID) | INTRAVENOUS | Status: DC
  Administered 2016-01-22: 1 g via INTRAVENOUS
  Filled 2016-01-22: qty 50

## 2016-01-22 MED ORDER — HYDROMORPHONE HCL 1 MG/ML IJ SOLN
INTRAMUSCULAR | Status: AC
  Administered 2016-01-22: 0.5 mg via INTRAVENOUS
  Filled 2016-01-22: qty 1

## 2016-01-22 MED ORDER — ONDANSETRON HCL 4 MG/2ML IJ SOLN
INTRAMUSCULAR | Status: AC
  Filled 2016-01-22: qty 2

## 2016-01-22 MED ORDER — GLYCOPYRROLATE 0.2 MG/ML IV SOSY
PREFILLED_SYRINGE | INTRAVENOUS | Status: AC
  Filled 2016-01-22: qty 3

## 2016-01-22 MED ORDER — MIDAZOLAM HCL 5 MG/5ML IJ SOLN
INTRAMUSCULAR | Status: DC | PRN
  Administered 2016-01-22: 2 mg via INTRAVENOUS

## 2016-01-22 MED ORDER — OXYCODONE-ACETAMINOPHEN 5-325 MG PO TABS
1.0000 | ORAL_TABLET | ORAL | Status: DC | PRN
  Administered 2016-01-22: 2 via ORAL
  Filled 2016-01-22: qty 2

## 2016-01-22 MED ORDER — MENTHOL 3 MG MT LOZG: 1.0000 | LOZENGE | OROMUCOSAL | Status: DC | PRN

## 2016-01-22 MED ORDER — ZOLPIDEM TARTRATE 5 MG PO TABS
2.5000 mg | ORAL_TABLET | Freq: Every evening | ORAL | Status: DC | PRN
Start: 2016-01-22 — End: 2016-01-22

## 2016-01-22 MED ORDER — SUFENTANIL CITRATE 50 MCG/ML IV SOLN
INTRAVENOUS | Status: DC | PRN
  Administered 2016-01-22: 15 ug via INTRAVENOUS

## 2016-01-22 MED ORDER — HYDROMORPHONE HCL 1 MG/ML IJ SOLN
INTRAMUSCULAR | Status: AC
  Filled 2016-01-22: qty 1

## 2016-01-22 MED ORDER — ACETAMINOPHEN 325 MG PO TABS: 650.0000 mg | ORAL_TABLET | ORAL | Status: DC | PRN

## 2016-01-22 MED ORDER — SODIUM CHLORIDE 0.9 % IR SOLN
Status: DC | PRN
  Administered 2016-01-22: 500 mL

## 2016-01-22 MED ORDER — THROMBIN 5000 UNITS EX SOLR
OROMUCOSAL | Status: DC | PRN
  Administered 2016-01-22: 10 mL via TOPICAL

## 2016-01-22 MED ORDER — OXYCODONE-ACETAMINOPHEN 5-325 MG PO TABS: 1.0000 | ORAL_TABLET | ORAL | Status: DC | PRN

## 2016-01-22 MED ORDER — DEXAMETHASONE SODIUM PHOSPHATE 10 MG/ML IJ SOLN
INTRAMUSCULAR | Status: DC | PRN
  Administered 2016-01-22: 10 mg via INTRAVENOUS

## 2016-01-22 MED ORDER — ACETAMINOPHEN 650 MG RE SUPP: 650.0000 mg | RECTAL | Status: DC | PRN

## 2016-01-22 MED ORDER — LACTATED RINGERS IV SOLN
INTRAVENOUS | Status: DC | PRN
  Administered 2016-01-22: 07:00:00 via INTRAVENOUS

## 2016-01-22 MED ORDER — NEOSTIGMINE METHYLSULFATE 5 MG/5ML IV SOSY
PREFILLED_SYRINGE | INTRAVENOUS | Status: DC | PRN
  Administered 2016-01-22: 2 mg via INTRAVENOUS

## 2016-01-22 MED ORDER — HYDROMORPHONE HCL 1 MG/ML IJ SOLN
0.2500 mg | INTRAMUSCULAR | Status: DC | PRN
  Administered 2016-01-22 (×4): 0.5 mg via INTRAVENOUS

## 2016-01-22 SURGICAL SUPPLY — 41 items
BAG DECANTER FOR FLEXI CONT (MISCELLANEOUS) ×2 IMPLANT
BENZOIN TINCTURE PRP APPL 2/3 (GAUZE/BANDAGES/DRESSINGS) ×2 IMPLANT
BIT DRILL PRESTIGE (BIT) ×2 IMPLANT
BUR MATCHSTICK NEURO 3.0 LAGG (BURR) ×2 IMPLANT
CAGE PRESTIGE LP 5X14 (Cage) ×2 IMPLANT
CANISTER SUCT 3000ML PPV (MISCELLANEOUS) ×2 IMPLANT
DRAPE C-ARM 42X72 X-RAY (DRAPES) ×4 IMPLANT
DRAPE LAPAROTOMY 100X72 PEDS (DRAPES) ×2 IMPLANT
DRAPE MICROSCOPE LEICA (MISCELLANEOUS) ×2 IMPLANT
DRAPE POUCH INSTRU U-SHP 10X18 (DRAPES) ×2 IMPLANT
DRSG OPSITE POSTOP 4X6 (GAUZE/BANDAGES/DRESSINGS) ×2 IMPLANT
DURAPREP 6ML APPLICATOR 50/CS (WOUND CARE) ×2 IMPLANT
ELECT COATED BLADE 2.86 ST (ELECTRODE) ×2 IMPLANT
ELECT REM PT RETURN 9FT ADLT (ELECTROSURGICAL) ×2
ELECTRODE REM PT RTRN 9FT ADLT (ELECTROSURGICAL) ×1 IMPLANT
GAUZE SPONGE 4X4 16PLY XRAY LF (GAUZE/BANDAGES/DRESSINGS) IMPLANT
GLOVE BIO SURGEON STRL SZ8 (GLOVE) ×2 IMPLANT
GOWN STRL REUS W/ TWL LRG LVL3 (GOWN DISPOSABLE) IMPLANT
GOWN STRL REUS W/ TWL XL LVL3 (GOWN DISPOSABLE) ×1 IMPLANT
GOWN STRL REUS W/TWL 2XL LVL3 (GOWN DISPOSABLE) IMPLANT
GOWN STRL REUS W/TWL LRG LVL3 (GOWN DISPOSABLE)
GOWN STRL REUS W/TWL XL LVL3 (GOWN DISPOSABLE) ×1
HALTER HD/CHIN CERV TRACTION D (MISCELLANEOUS) IMPLANT
HEMOSTAT POWDER KIT SURGIFOAM (HEMOSTASIS) ×2 IMPLANT
KIT BASIN OR (CUSTOM PROCEDURE TRAY) ×2 IMPLANT
KIT ROOM TURNOVER OR (KITS) ×2 IMPLANT
NEEDLE HYPO 25X1 1.5 SAFETY (NEEDLE) ×2 IMPLANT
NEEDLE SPNL 20GX3.5 QUINCKE YW (NEEDLE) ×2 IMPLANT
NS IRRIG 1000ML POUR BTL (IV SOLUTION) ×2 IMPLANT
PACK LAMINECTOMY NEURO (CUSTOM PROCEDURE TRAY) ×2 IMPLANT
PAD ARMBOARD 7.5X6 YLW CONV (MISCELLANEOUS) ×2 IMPLANT
RUBBERBAND STERILE (MISCELLANEOUS) ×4 IMPLANT
SPONGE INTESTINAL PEANUT (DISPOSABLE) ×2 IMPLANT
SPONGE SURGIFOAM ABS GEL 100 (HEMOSTASIS) ×2 IMPLANT
STRIP CLOSURE SKIN 1/2X4 (GAUZE/BANDAGES/DRESSINGS) ×2 IMPLANT
SUT VIC AB 3-0 SH 8-18 (SUTURE) ×2 IMPLANT
TAPE STRIPS DRAPE STRL (GAUZE/BANDAGES/DRESSINGS) ×2 IMPLANT
TOWEL OR 17X24 6PK STRL BLUE (TOWEL DISPOSABLE) ×2 IMPLANT
TOWEL OR 17X26 10 PK STRL BLUE (TOWEL DISPOSABLE) ×2 IMPLANT
TRAP SPECIMEN MUCOUS 40CC (MISCELLANEOUS) IMPLANT
WATER STERILE IRR 1000ML POUR (IV SOLUTION) ×2 IMPLANT

## 2016-01-22 NOTE — Discharge Summary (Signed)
Physician Discharge Summary  Patient ID: Angela Sims MRN: [id number] DOB/AGE: 11-Nov-1970 45 y.o.  Admit date: 01/22/2016 Discharge date: 01/22/2016  Admission Diagnoses: Cervical disc herniation    Discharge Diagnoses: Same   Discharged Condition: good  Hospital Course: The patient was admitted on 01/22/2016 and taken to the operating room where the patient underwent C5-6 total disc arthroplasty. The patient tolerated the procedure well and was taken to the recovery room and then to the floor in stable condition. The hospital course was routine. There were no complications. The wound remained clean dry and intact. Pt had appropriate neck soreness. No complaints of arm pain or new N/T/W. The patient remained afebrile with stable vital signs, and tolerated a regular diet. The patient continued to increase activities, and pain was well controlled with oral pain medications.   Consults: None  Significant Diagnostic Studies:  Results for orders placed or performed during the hospital encounter of 01/13/16  Surgical pcr screen  Result Value Ref Range   MRSA, PCR NEGATIVE NEGATIVE   Staphylococcus aureus NEGATIVE NEGATIVE  hCG, serum, qualitative  Result Value Ref Range   Preg, Serum NEGATIVE NEGATIVE  CBC  Result Value Ref Range   WBC 14.4 (H) 4.0 - 10.5 K/uL   RBC 4.24 3.87 - 5.11 MIL/uL   Hemoglobin 12.2 12.0 - 15.0 g/dL   HCT 78.2 95.6 - 21.3 %   MCV 90.6 78.0 - 100.0 fL   MCH 28.8 26.0 - 34.0 pg   MCHC 31.8 30.0 - 36.0 g/dL   RDW 08.6 57.8 - 46.9 %   Platelets 361 150 - 400 K/uL    Dg Cervical Spine 2-3 Views  01/22/2016  CLINICAL DATA:  Neck pain. EXAM: DG C-ARM 61-120 MIN; CERVICAL SPINE - 2-3 VIEW COMPARISON:  No recent prior. FINDINGS: C5-C6 interbody fusion hardware. Hardware intact. Good anatomic alignment . No acute bony abnormality identified. Two images. 0 minutes 14 seconds fluoroscopic time. IMPRESSION: C5-C6 interbody fusion hardware noted. Hardware intact.  Good anatomic alignment. Electronically Signed   By: Maisie Fus  Register   On: 01/22/2016 08:52   Dg C-arm 1-60 Min  01/22/2016  CLINICAL DATA:  Neck pain. EXAM: DG C-ARM 61-120 MIN; CERVICAL SPINE - 2-3 VIEW COMPARISON:  No recent prior. FINDINGS: C5-C6 interbody fusion hardware. Hardware intact. Good anatomic alignment . No acute bony abnormality identified. Two images. 0 minutes 14 seconds fluoroscopic time. IMPRESSION: C5-C6 interbody fusion hardware noted. Hardware intact. Good anatomic alignment. Electronically Signed   By: Maisie Fus  Register   On: 01/22/2016 08:52    Antibiotics:  Anti-infectives    Start     Dose/Rate Route Frequency Ordered Stop   01/22/16 1500  ceFAZolin (ANCEF) IVPB 1 g/50 mL premix     1 g 100 mL/hr over 30 Minutes Intravenous Every 8 hours 01/22/16 1008 01/23/16 0659   01/22/16 0752  bacitracin 50,000 Units in sodium chloride irrigation 0.9 % 500 mL irrigation  Status:  Discontinued       As needed 01/22/16 0752 01/22/16 0854   01/22/16 0600  ceFAZolin (ANCEF) IVPB 2g/100 mL premix     2 g 200 mL/hr over 30 Minutes Intravenous On call to O.R. 01/21/16 1334 01/22/16 0719      Discharge Exam: Blood pressure 129/75, pulse 81, temperature 97.4 F (36.3 C), temperature source Oral, resp. rate 18, weight 63.05 kg (139 lb), SpO2 98 %. Neurologic: Grossly normal Dressing dry  Discharge Medications:     Medication List    TAKE these medications  albuterol 108 (90 Base) MCG/ACT inhaler  Commonly known as:  PROVENTIL HFA;VENTOLIN HFA  Inhale 1-2 puffs into the lungs daily as needed for wheezing or shortness of breath.     baclofen 10 MG tablet  Commonly known as:  LIORESAL  Take 10 mg by mouth 3 (three) times daily.     celecoxib 200 MG capsule  Commonly known as:  CELEBREX  Take 200 mg by mouth 3 (three) times daily.     fluticasone 50 MCG/ACT nasal spray  Commonly known as:  FLONASE  Place 1 spray into both nostrils daily.     MULTIVITAMIN PO   Take 1 tablet by mouth daily.     norethindrone-ethinyl estradiol 1/35 tablet  Commonly known as:  ORTHO-NOVUM, NORTREL,CYCLAFEM  Take 1 tablet by mouth at bedtime.     oxyCODONE-acetaminophen 5-325 MG tablet  Commonly known as:  PERCOCET/ROXICET  Take 1-2 tablets by mouth every 4 (four) hours as needed for moderate pain.     zolpidem 10 MG tablet  Commonly known as:  AMBIEN  Take 2.5 mg by mouth at bedtime as needed for sleep. Patient breaks tablet up into 4 and only takes 1/4 of a tablet when needed        Disposition: Home   Final Dx: C5-6 disc arthroplasty      Discharge Instructions     Remove dressing in 72 hours    Complete by:  As directed      Call MD for:  difficulty breathing, headache or visual disturbances    Complete by:  As directed      Call MD for:  persistant nausea and vomiting    Complete by:  As directed      Call MD for:  redness, tenderness, or signs of infection (pain, swelling, redness, odor or green/yellow discharge around incision site)    Complete by:  As directed      Call MD for:  severe uncontrolled pain    Complete by:  As directed      Call MD for:  temperature >100.4    Complete by:  As directed      Diet - low sodium heart healthy    Complete by:  As directed      Discharge instructions    Complete by:  As directed   May shower, no heavy lifting, up and around as tolerated     Increase activity slowly    Complete by:  As directed               Signed: Kalah Pflum S 01/22/2016, 2:52 PM

## 2016-01-22 NOTE — Anesthesia Preprocedure Evaluation (Addendum)
Anesthesia Evaluation  Patient identified by MRN, date of birth, ID band Patient awake    Reviewed: Allergy & Precautions, NPO status , Patient's Chart, lab work & pertinent test results  History of Anesthesia Complications (+) PONV  Airway Mallampati: II  TM Distance: >3 FB Neck ROM: Full    Dental  (+) Teeth Intact, Dental Advisory Given   Pulmonary neg pulmonary ROS,    breath sounds clear to auscultation       Cardiovascular negative cardio ROS   Rhythm:Regular     Neuro/Psych negative neurological ROS  negative psych ROS   GI/Hepatic negative GI ROS, Neg liver ROS,   Endo/Other  negative endocrine ROS  Renal/GU negative Renal ROS  negative genitourinary   Musculoskeletal  (+) Fibromyalgia -  Abdominal   Peds negative pediatric ROS (+)  Hematology negative hematology ROS (+) 13/41   Anesthesia Other Findings   Reproductive/Obstetrics negative OB ROS                            Anesthesia Physical  Anesthesia Plan  ASA: II  Anesthesia Plan: General   Post-op Pain Management:    Induction: Intravenous  Airway Management Planned: Oral ETT  Additional Equipment:   Intra-op Plan:   Post-operative Plan: Extubation in OR  Informed Consent: I have reviewed the patients History and Physical, chart, labs and discussed the procedure including the risks, benefits and alternatives for the proposed anesthesia with the patient or authorized representative who has indicated his/her understanding and acceptance.   Dental advisory given  Plan Discussed with: CRNA and Anesthesiologist  Anesthesia Plan Comments: (Consider glidescope for intubation if symptomatic cervical myalgia present, PONV prophylaxis)        Anesthesia Quick Evaluation

## 2016-01-22 NOTE — Progress Notes (Signed)
Pt doing well. Pt and husband given D/C instructions with Rx, verbal understanding was provided. Pt's incision is clean and dry with no sign of infection. Pt's IV was removed prior to D/C. Pt D/C'd home via wheelchair @ 1550 per MD order. Pt is stable @ D/C and has no other needs at this time. Rema FendtAshley Anthonio Mizzell, RN

## 2016-01-22 NOTE — Anesthesia Procedure Notes (Signed)
Procedure Name: Intubation Date/Time: 01/22/2016 7:29 AM Performed by: Charm BargesBUTLER, Dameer Speiser R Pre-anesthesia Checklist: Patient identified, Emergency Drugs available, Suction available and Patient being monitored Patient Re-evaluated:Patient Re-evaluated prior to inductionOxygen Delivery Method: Circle System Utilized Preoxygenation: Pre-oxygenation with 100% oxygen Intubation Type: IV induction Ventilation: Mask ventilation without difficulty Laryngoscope Size: Mac and 3 Grade View: Grade I Tube type: Oral Tube size: 7.5 mm Number of attempts: 1 Airway Equipment and Method: Stylet Placement Confirmation: ETT inserted through vocal cords under direct vision,  positive ETCO2 and breath sounds checked- equal and bilateral Secured at: 21 cm Tube secured with: Tape Dental Injury: Teeth and Oropharynx as per pre-operative assessment

## 2016-01-22 NOTE — Discharge Instructions (Signed)

## 2016-01-22 NOTE — Transfer of Care (Signed)
Immediate Anesthesia Transfer of Care Note  Patient: Angela Sims  Procedure(s) Performed: Procedure(s) with comments: C5-6 Cervical artificial disc replacment (N/A) - C5-6 Cervical artificial disc replacment  Patient Location: PACU  Anesthesia Type:General  Level of Consciousness: awake, oriented and patient cooperative  Airway & Oxygen Therapy: Patient Spontanous Breathing and Patient connected to nasal cannula oxygen  Post-op Assessment: Report given to RN, Post -op Vital signs reviewed and stable and Patient moving all extremities  Post vital signs: Reviewed and stable  Last Vitals:  Filed Vitals:   01/22/16 0627  BP: 138/78  Pulse: 88  Temp: 36.9 C  Resp: 20    Last Pain: There were no vitals filed for this visit.       Complications: No apparent anesthesia complications

## 2016-01-22 NOTE — Op Note (Signed)
01/22/2016  8:56 AM  PATIENT:  Angela Sims  45 y.o. 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 MRI showed a large acute disc herniation at C5-6 to the right. She tried medical management without relief. Commended cervical disc arthroplasty C5-6. Patient understood the risks, benefits, and alternatives and potential outcomes and wished to proceed.  PROCEDURE DETAILS: Patient was brought to the operating room placed under general endotracheal anesthesia. Patient was placed in the supine position on the operating room table. The neck was prepped with Duraprep and draped in a sterile fashion.   Three cc of local anesthesia was injected and a transverse incision was made on the right side of the neck.  Dissection was carried down thru the subcutaneous tissue and the platysma was  elevated, opened, and undermined with Metzenbaum scissors.  Dissection was then carried out thru an avascular plane leaving the sternocleidomastoid carotid artery and jugular vein laterally and the trachea and esophagus medially. The ventral aspect of the vertebral column was identified and a localizing x-ray was taken. The C5-6 level was identified. The longus colli muscles were then elevated and the retractor was placed. The annulus was incised and the disc space entered. Discectomy was performed with micro-curettes and pituitary rongeurs. I then used the high-speed drill to drill the endplates down to the level of the posterior longitudinal ligament.  The operating microscope was  draped and brought into the field provided additional magnification, illumination and visualization. Discectomy was continued posteriorly thru the disc space. Posterior longitudinal ligament was opened with a nerve hook, and then removed along with disc herniation and osteophytes, decompressing the spinal canal and thecal sac. We then continued to remove osteophytic overgrowth and disc material decompressing the neural foramina and exiting nerve roots bilaterally. Particular attention was placed on the right-hand side where a large disc herniation was found and removed. The scope was angled up and down to help decompress and undercut the vertebral bodies. Once the decompression was completed we could pass a nerve hook circumferentially to assure adequate decompression in the midline and in the neural foramina. So by both visualization and palpation we felt we had an adequate decompression of the neural elements. I then used sequential trials to determine the size of the arthroplasty. The 5 x 14 fit the best. I therefore placed our drill guide and drilled small holes into the upper and lower vertebral bodies and then removed the drill guide. We then used the cutting chisel to prepare for placement of the device. The cervical disc arthroplasty was then placed into the interspace at C5-6 utilizing lateral fluoroscopy. The placement was then checked with AP and lateral fluoroscopy. The wound was irrigated with bacitracin solution, checked for hemostasis which was established and confirmed. Once meticulous hemostasis was achieved, we then proceeded with closure. The platysma was closed with interrupted 3-0 undyed Vicryl suture, the subcuticular layer was closed with interrupted 3-0 undyed Vicryl suture. The skin edges were approximated with steristrips. The drapes were removed. A sterile dressing was applied. The patient was then awakened  from general anesthesia and transferred to the recovery room in stable condition.  At the end of the procedure all sponge, needle and instrument counts were correct.   PLAN OF CARE: Admit for overnight observation  PATIENT DISPOSITION:  PACU - hemodynamically stable.   Delay start of Pharmacological VTE agent (>24hrs) due to surgical blood loss or risk of bleeding:  yes

## 2016-01-22 NOTE — Anesthesia Postprocedure Evaluation (Signed)
Anesthesia Post Note  Patient: Angela Sims  Procedure(s) Performed: Procedure(s) (LRB): C5-6 Cervical artificial disc replacment (N/A)  Patient location during evaluation: PACU Anesthesia Type: General Level of consciousness: awake and alert Pain management: pain level controlled Vital Signs Assessment: post-procedure vital signs reviewed and stable Respiratory status: spontaneous breathing, nonlabored ventilation, respiratory function stable and patient connected to nasal cannula oxygen Cardiovascular status: blood pressure returned to baseline and stable Postop Assessment: no signs of nausea or vomiting Anesthetic complications: no    Last Vitals:  Filed Vitals:   01/22/16 0627 01/22/16 0853  BP: 138/78 124/65  Pulse: 88 78  Temp: 36.9 C 36.7 C  Resp: 20 23    Last Pain:  Filed Vitals:   01/22/16 0916  PainSc: 6                  Reino KentJudd, Zenola Dezarn J

## 2016-01-22 NOTE — H&P (Signed)
Subjective:   Patient is a 45 y.o. female admitted for neck and arm pain. The patient first presented to me with complaints of neck pain, shooting pains in the arm(s) and numbness of the arm(s). Onset of symptoms was several months ago. The pain is described as aching and occurs all day. The pain is rated severe, and is located  In the neck and radiates to the arms. The symptoms have been progressive. Symptoms are exacerbated by extending head backwards, and are relieved by none.  Previous work up includes MRI of cervical spine, results: disc bulge at C5-C6 bilateral.  Past Medical History  Diagnosis Date  . High cholesterol   . Fibromyalgia   . Myofascial pain syndrome   . Seasonal allergies   . Deviated septum     right side  . Restless leg syndrome   . Herniated cervical disc 12/02/15  . Complication of anesthesia   . PONV (postoperative nausea and vomiting) 12/05/15    Past Surgical History  Procedure Laterality Date  . No past surgeries    . Wisdom tooth extraction    . Lumbar laminectomy/decompression microdiscectomy Right 06/25/2015    Procedure: LUMBAR LAMINECTOMY/DECOMPRESSION MICRODISCECTOMY 1 LEVEL; RIGHT ;  Surgeon: Tia Alert, MD;  Location: [facility];  Service: Neurosurgery;  Laterality: Right;  . Lumbar fusion  12/05/2015  . Anterior lumbar fusion N/A 12/05/2015    Procedure: ANTERIOR LUMBAR FUSION LUMBAR FIVE-SACRAL ONE ;  Surgeon: Tia Alert, MD;  Location: [facility];  Service: Neurosurgery;  Laterality: N/A;  Abdominal approach  . Abdominal exposure N/A 12/05/2015    Procedure: ABDOMINAL EXPOSURE;  Surgeon: Larina Earthly, MD;  Location: [facility];  Service: Vascular;  Laterality: N/A;    No Known Allergies  Social History  Substance Use Topics  . Smoking status: Never Smoker   . Smokeless tobacco: Never Used  . Alcohol Use: Yes     Comment: 1 or 2 drinks occasionally    Family History  Problem Relation Age of Onset  . Bladder Cancer Mother   .  Alcohol abuse Father    Prior to Admission medications   Medication Sig Start Date End Date Taking? Authorizing Provider  albuterol (PROVENTIL HFA;VENTOLIN HFA) 108 (90 BASE) MCG/ACT inhaler Inhale 1-2 puffs into the lungs daily as needed for wheezing or shortness of breath.    Yes Historical Provider, MD  baclofen (LIORESAL) 10 MG tablet Take 10 mg by mouth 3 (three) times daily.  10/30/15  Yes Historical Provider, MD  celecoxib (CELEBREX) 200 MG capsule Take 200 mg by mouth 3 (three) times daily.   Yes Historical Provider, MD  fluticasone (FLONASE) 50 MCG/ACT nasal spray Place 1 spray into both nostrils daily.   Yes Historical Provider, MD  Multiple Vitamins-Minerals (MULTIVITAMIN PO) Take 1 tablet by mouth daily.   Yes Historical Provider, MD  norethindrone-ethinyl estradiol 1/35 (ORTHO-NOVUM, NORTREL,CYCLAFEM) tablet Take 1 tablet by mouth at bedtime.    Yes Historical Provider, MD  oxyCODONE-acetaminophen (PERCOCET/ROXICET) 5-325 MG tablet Take 1-2 tablets by mouth every 4 (four) hours as needed for moderate pain. Patient taking differently: Take 1 tablet by mouth daily as needed for moderate pain.  12/06/15   Maeola Harman, MD  zolpidem (AMBIEN) 10 MG tablet Take 2.5 mg by mouth at bedtime as needed for sleep. Patient breaks tablet up into 4 and only takes 1/4 of a tablet when needed    Historical Provider, MD     Review of Systems  Positive  ROS: neg  All other systems have been reviewed and were otherwise negative with the exception of those mentioned in the HPI and as above.  Objective: Vital signs in last 24 hours: Temp:  [98.4 F (36.9 C)] 98.4 F (36.9 C) (05/18 0627) Pulse Rate:  [88] 88 (05/18 0627) Resp:  [20] 20 (05/18 0627) BP: (138)/(78) 138/78 mmHg (05/18 0627) SpO2:  [100 %] 100 % (05/18 0627) Weight:  [63.05 kg (139 lb)] 63.05 kg (139 lb) (05/18 0627)  General Appearance: Alert, cooperative, no distress, appears stated age Head: Normocephalic, without obvious  abnormality, atraumatic Eyes: PERRL, conjunctiva/corneas clear, EOM's intact      Neck: Supple, symmetrical, trachea midline, Back: Symmetric, no curvature, ROM normal, no CVA tenderness Lungs:  respirations unlabored Heart: Regular rate and rhythm Abdomen: Soft, non-tender Extremities: Extremities normal, atraumatic, no cyanosis or edema Pulses: 2+ and symmetric all extremities Skin: Skin color, texture, turgor normal, no rashes or lesions  NEUROLOGIC:  Mental status: Alert and oriented x4, no aphasia, good attention span, fund of knowledge and memory  Motor Exam - grossly normal Sensory Exam - grossly normal Reflexes: 1+ Coordination - grossly normal Gait - grossly normal Balance - grossly normal Cranial Nerves: I: smell Not tested  II: visual acuity  OS: nl    OD: nl  II: visual fields Full to confrontation  II: pupils Equal, round, reactive to light  III,VII: ptosis None  III,IV,VI: extraocular muscles  Full ROM  V: mastication Normal  V: facial light touch sensation  Normal  V,VII: corneal reflex  Present  VII: facial muscle function - upper  Normal  VII: facial muscle function - lower Normal  VIII: hearing Not tested  IX: soft palate elevation  Normal  IX,X: gag reflex Present  XI: trapezius strength  5/5  XI: sternocleidomastoid strength 5/5  XI: neck flexion strength  5/5  XII: tongue strength  Normal    Data Review Lab Results  Component Value Date   WBC 14.4* 01/13/2016   HGB 12.2 01/13/2016   HCT 38.4 01/13/2016   MCV 90.6 01/13/2016   PLT 361 01/13/2016   Lab Results  Component Value Date   NA 139 11/26/2015   K 3.9 11/26/2015   CL 104 11/26/2015   CO2 21* 11/26/2015   BUN 18 11/26/2015   CREATININE 0.66 11/26/2015   GLUCOSE 89 11/26/2015   Lab Results  Component Value Date   INR 0.95 11/26/2015    Assessment:   Cervical neck pain with herniated nucleus pulposus/ 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 Patient has failed conservative therapy.  Planned surgery : TDR C5-6  Plan:   I explained the condition and procedure to the patient and answered any questions.  Patient wishes to proceed with procedure as planned. Understands risks/ benefits/ and expected or typical outcomes.  Camesha Farooq S 01/22/2016 6:35 AM

## 2016-01-23 ENCOUNTER — Encounter (HOSPITAL_COMMUNITY): Payer: Self-pay | Admitting: Neurological Surgery

## 2016-01-28 ENCOUNTER — Encounter (HOSPITAL_COMMUNITY): Payer: Self-pay | Admitting: Neurological Surgery

## 2016-09-30 DIAGNOSIS — M47812 Spondylosis without myelopathy or radiculopathy, cervical region: Secondary | ICD-10-CM | POA: Insufficient documentation

## 2016-11-18 ENCOUNTER — Other Ambulatory Visit: Payer: Self-pay | Admitting: Pediatric Genetics

## 2016-11-18 DIAGNOSIS — M5416 Radiculopathy, lumbar region: Secondary | ICD-10-CM

## 2016-11-29 IMAGING — CR DG CHEST 2V
2 series · 2 of 2 positions shown · non-contrast
Comparison: None.

CLINICAL DATA: Preop for lumbar spine surgery

EXAM:
CHEST  2 VIEW

[w chest pa]
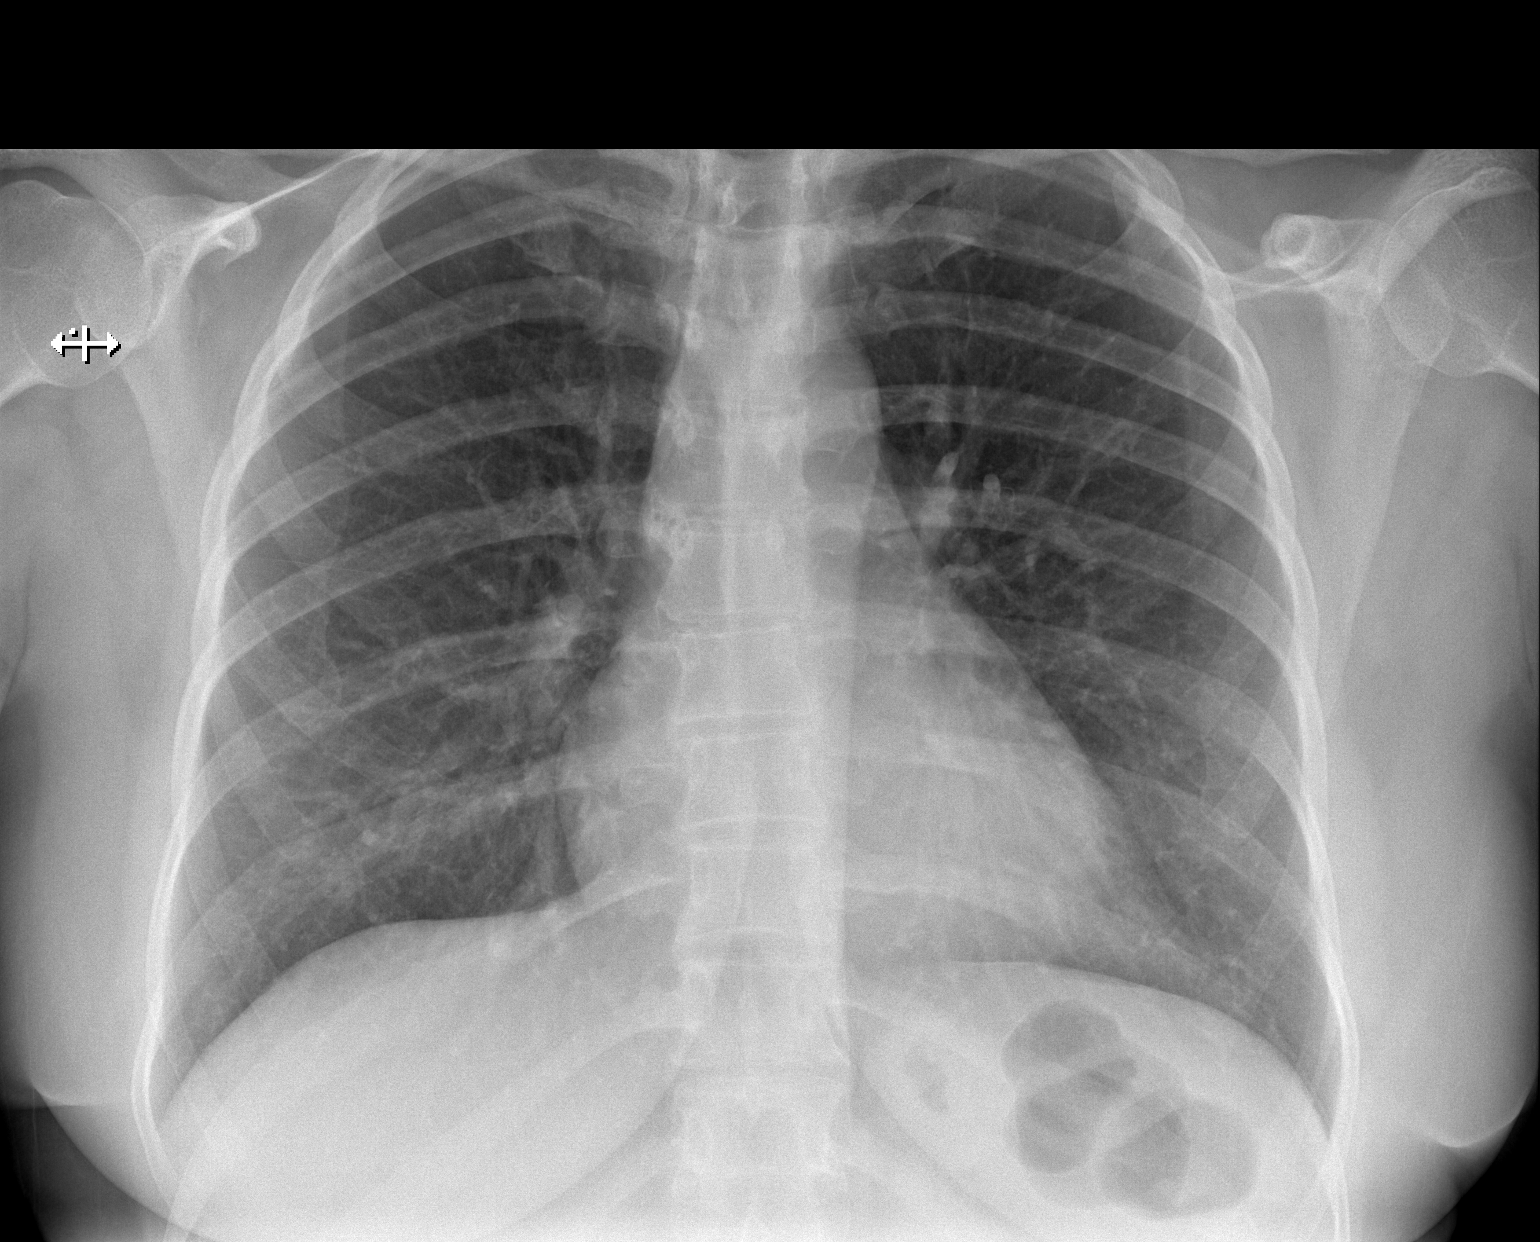

[w chest lat]
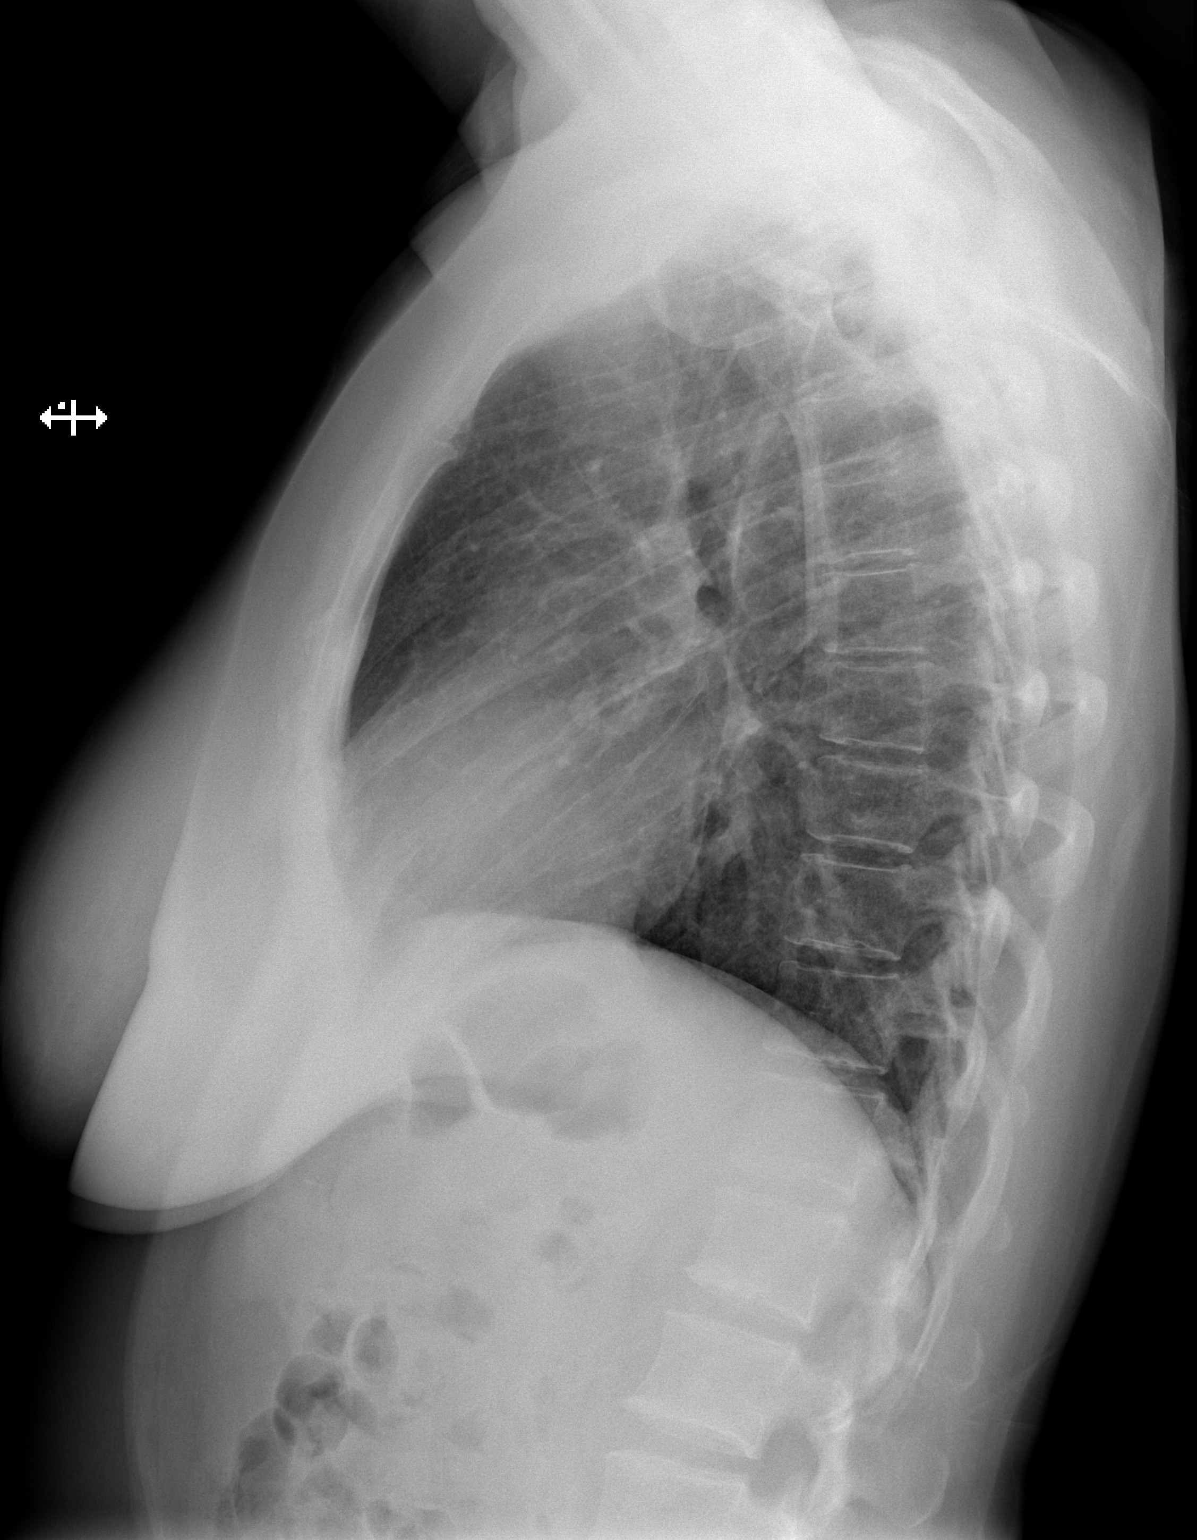

[2 of 2 positions shown; findings below may reference images not displayed]

FINDINGS: No active infiltrate or effusion is seen. Mediastinal and hilar
contours are unremarkable. The heart is within normal limits in
size. No bony abnormality is seen.
IMPRESSION: No active cardiopulmonary disease.

## 2016-12-15 ENCOUNTER — Ambulatory Visit
Admission: RE | Admit: 2016-12-15 | Discharge: 2016-12-15 | Disposition: A | Discharge status: Home / Self Care | Payer: Medicare Other | Source: Ambulatory Visit | Attending: Pediatric Genetics | Admitting: Pediatric Genetics

## 2016-12-15 VITALS — BP 134/80 | HR 74

## 2016-12-15 DIAGNOSIS — M5416 Radiculopathy, lumbar region: Secondary | ICD-10-CM

## 2016-12-15 DIAGNOSIS — Z9889 Other specified postprocedural states: Secondary | ICD-10-CM

## 2016-12-15 DIAGNOSIS — Z981 Arthrodesis status: Secondary | ICD-10-CM

## 2016-12-15 MED ORDER — IOPAMIDOL (ISOVUE-M 200) INJECTION 41%
15.0000 mL | Freq: Once | INTRAMUSCULAR | Status: AC
  Administered 2016-12-15: 15 mL via INTRATHECAL

## 2016-12-15 MED ORDER — ONDANSETRON HCL 4 MG/2ML IJ SOLN: 4.0000 mg | Freq: Four times a day (QID) | INTRAMUSCULAR | Status: DC | PRN

## 2016-12-15 MED ORDER — DIAZEPAM 5 MG PO TABS
10.0000 mg | ORAL_TABLET | Freq: Once | ORAL | Status: AC
  Administered 2016-12-15: 10 mg via ORAL

## 2016-12-15 NOTE — Discharge Instructions (Signed)

## 2017-02-22 ENCOUNTER — Other Ambulatory Visit: Payer: Self-pay | Admitting: Neurological Surgery

## 2017-02-22 DIAGNOSIS — M5412 Radiculopathy, cervical region: Secondary | ICD-10-CM

## 2017-02-28 ENCOUNTER — Ambulatory Visit
Admission: RE | Admit: 2017-02-28 | Discharge: 2017-02-28 | Disposition: A | Discharge status: Home / Self Care | Payer: Medicare Other | Source: Ambulatory Visit | Attending: Neurological Surgery | Admitting: Neurological Surgery

## 2017-02-28 VITALS — BP 134/78 | HR 76

## 2017-02-28 DIAGNOSIS — M5412 Radiculopathy, cervical region: Secondary | ICD-10-CM

## 2017-02-28 DIAGNOSIS — M4802 Spinal stenosis, cervical region: Secondary | ICD-10-CM

## 2017-02-28 MED ORDER — IOPAMIDOL (ISOVUE-M 300) INJECTION 61%
10.0000 mL | Freq: Once | INTRAMUSCULAR | Status: AC | PRN
Start: 2017-02-28 — End: 2017-02-28
  Administered 2017-02-28: 10 mL via INTRATHECAL

## 2017-02-28 MED ORDER — DIAZEPAM 5 MG PO TABS: 10.0000 mg | ORAL_TABLET | Freq: Once | ORAL | Status: DC

## 2017-02-28 MED ORDER — ONDANSETRON HCL 4 MG/2ML IJ SOLN: 4.0000 mg | Freq: Four times a day (QID) | INTRAMUSCULAR | Status: DC | PRN

## 2017-02-28 NOTE — Discharge Instructions (Signed)

## 2017-05-17 IMAGING — RF DG C-ARM 61-120 MIN
1 series · 2 of 2 positions shown · non-contrast
Comparison: None.

CLINICAL DATA: L5-S1 ALIF

EXAM:
DG C-ARM 61-120 MIN

[Series 1: run · 2 of 2 slices shown]
[im 1/2]
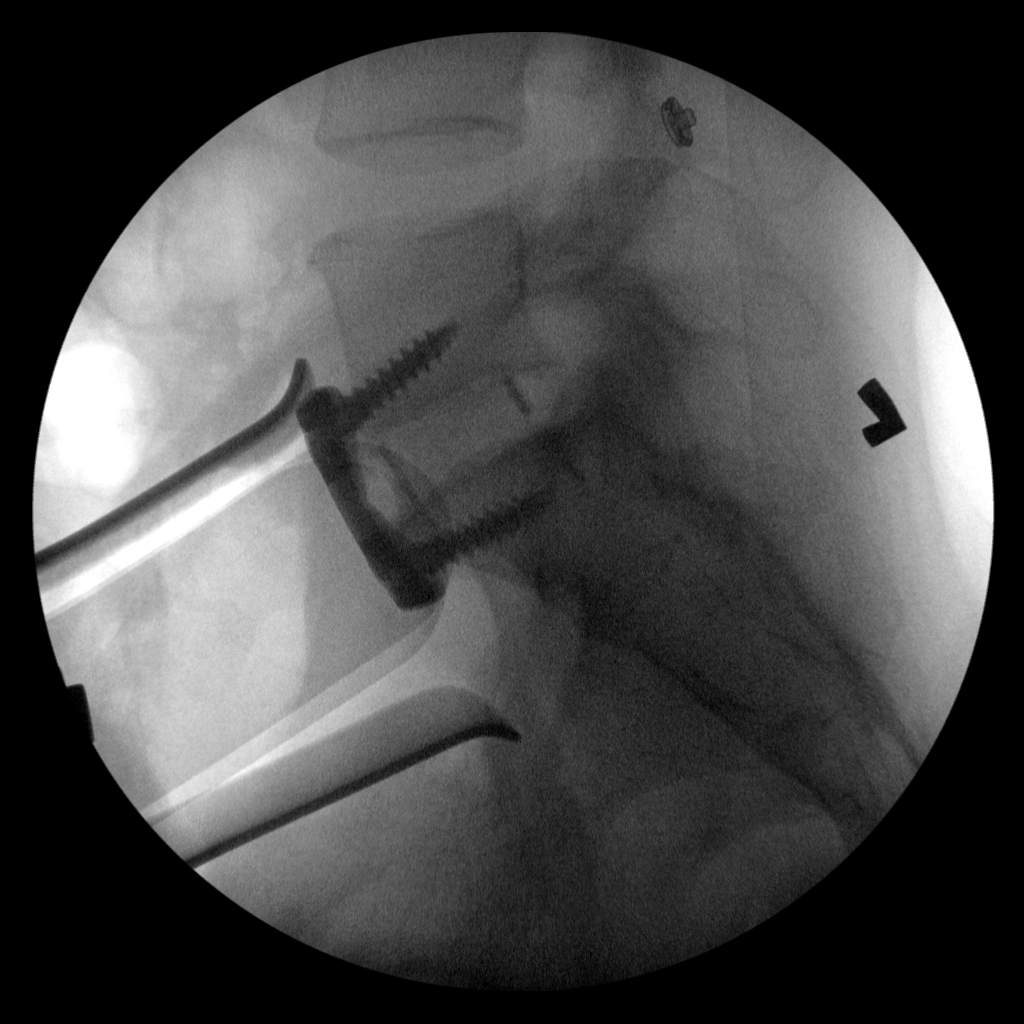
[im 2/2]
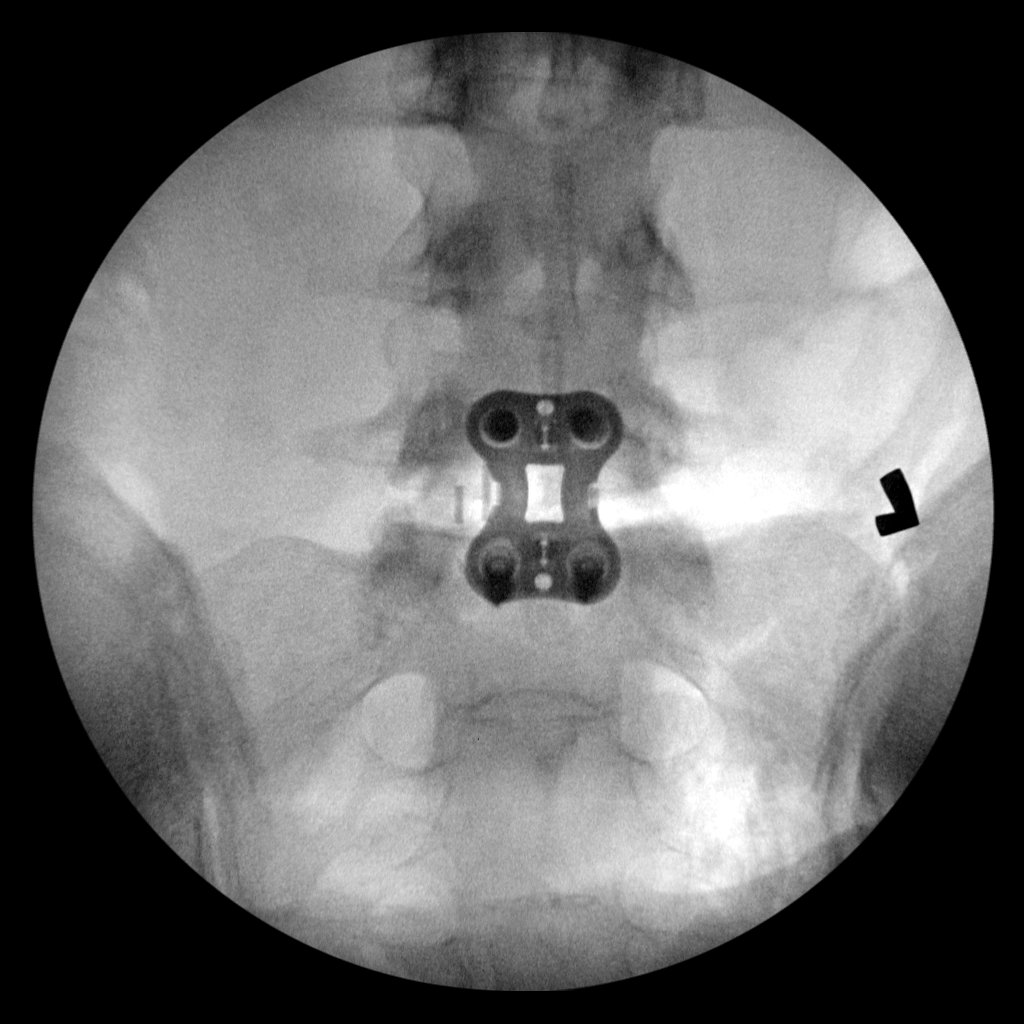

[2 of 2 positions shown; findings below may reference images not displayed]

FINDINGS: Two intraoperative spot fluoro images are obtained. Initial view at
7888 hours shows lateral projection of the lumbosacral junction with
interbody spacer and anterior plate and screw fixation device.
Retractors are identified anteriorly. Second image at 4597 hours
shows the L5-S1 anterior plate.
IMPRESSION: Intraoperative localization.

## 2017-05-17 IMAGING — CR DG OR LOCAL ABDOMEN
1 series · 1 of 1 positions shown · non-contrast
Comparison: None.

CLINICAL DATA: Abdominal surgery with an accurate instrument count

EXAM:
OR LOCAL ABDOMEN

[AP]
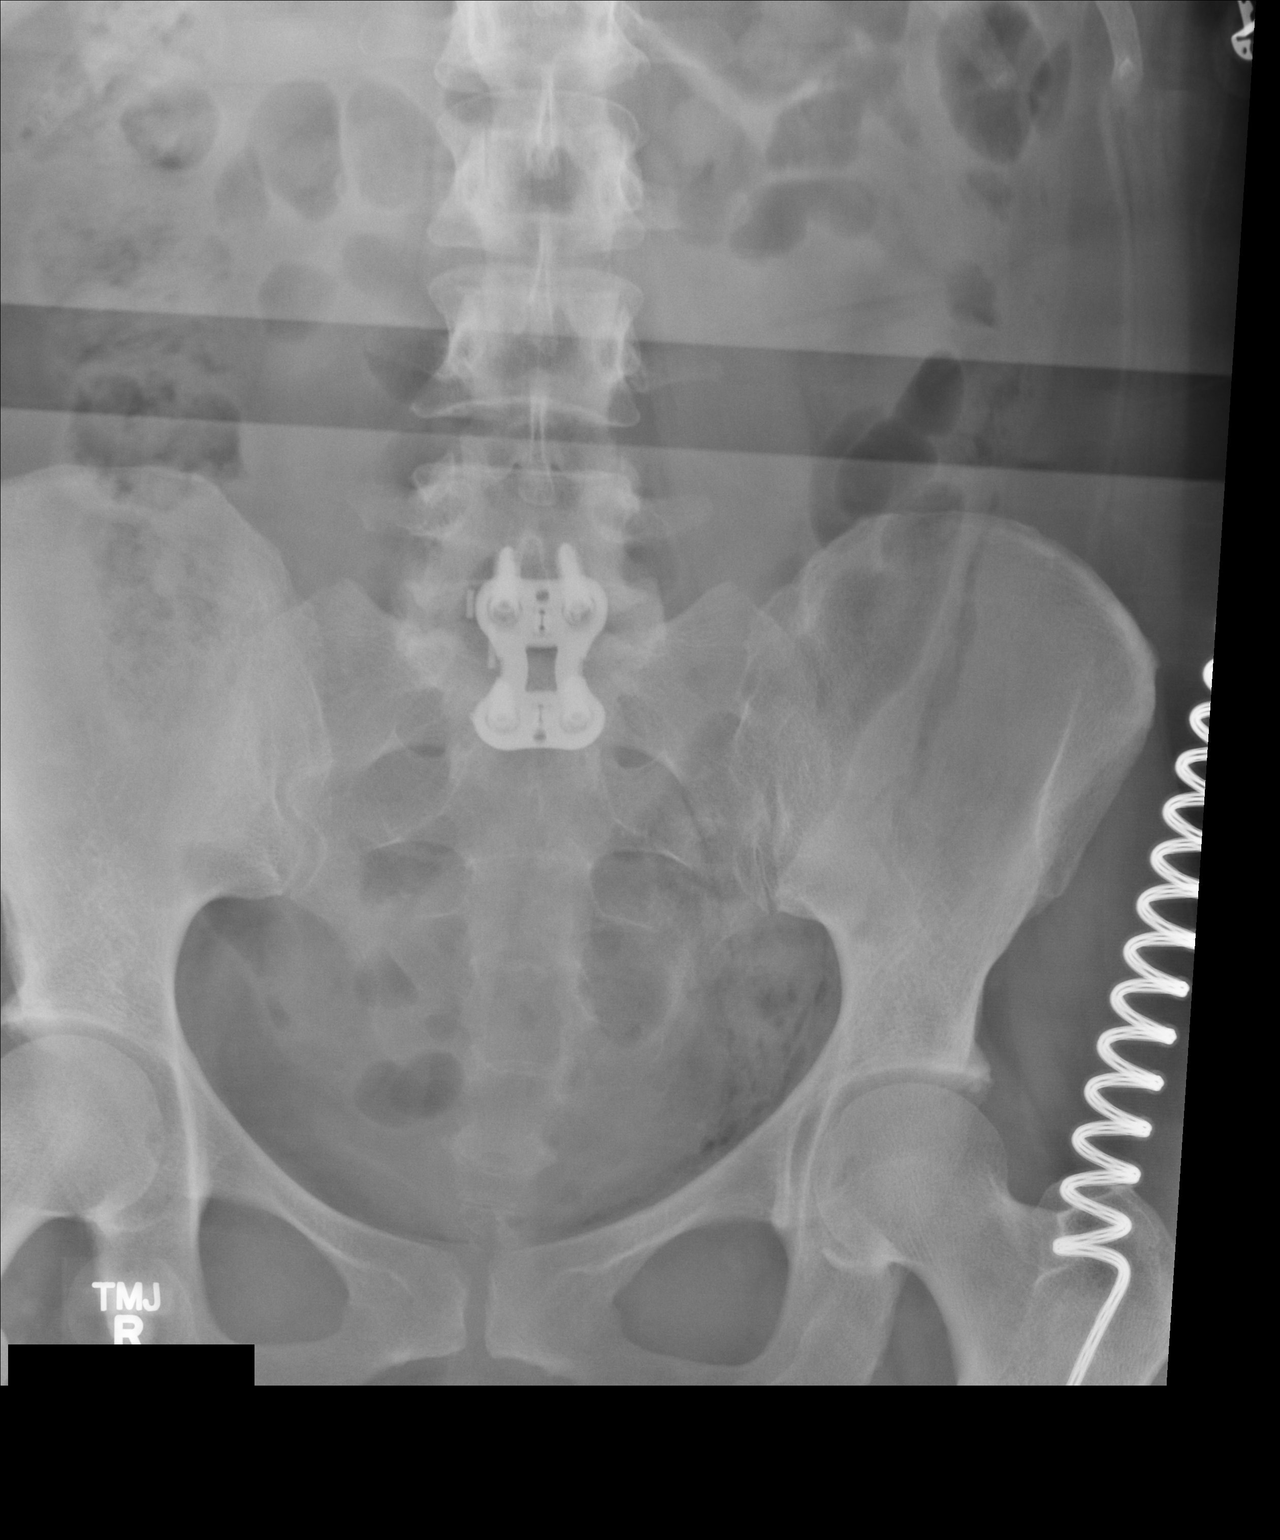

[1 of 1 positions shown; findings below may reference images not displayed]

FINDINGS: There is postoperative change at L5 and S1. There is no retained
instrument or needle. Bowel gas pattern unremarkable.
IMPRESSION: Bowel gas pattern unremarkable.  No retained instrument or needle.

## 2017-10-05 DIAGNOSIS — M542 Cervicalgia: Secondary | ICD-10-CM | POA: Insufficient documentation

## 2017-10-05 DIAGNOSIS — M961 Postlaminectomy syndrome, not elsewhere classified: Secondary | ICD-10-CM | POA: Insufficient documentation

## 2017-10-27 ENCOUNTER — Other Ambulatory Visit: Payer: Self-pay | Admitting: Neurological Surgery

## 2017-10-27 DIAGNOSIS — M961 Postlaminectomy syndrome, not elsewhere classified: Secondary | ICD-10-CM

## 2017-10-28 ENCOUNTER — Ambulatory Visit
Admission: RE | Admit: 2017-10-28 | Discharge: 2017-10-28 | Disposition: A | Discharge status: Home / Self Care | Payer: Medicare Other | Source: Ambulatory Visit | Attending: Neurological Surgery | Admitting: Neurological Surgery

## 2017-10-28 DIAGNOSIS — M961 Postlaminectomy syndrome, not elsewhere classified: Secondary | ICD-10-CM

## 2018-03-27 ENCOUNTER — Other Ambulatory Visit: Payer: Self-pay | Admitting: Neurological Surgery

## 2018-03-27 ENCOUNTER — Encounter: Payer: Self-pay | Admitting: Neurological Surgery

## 2018-03-27 DIAGNOSIS — S32009K Unspecified fracture of unspecified lumbar vertebra, subsequent encounter for fracture with nonunion: Secondary | ICD-10-CM

## 2018-03-27 DIAGNOSIS — M542 Cervicalgia: Secondary | ICD-10-CM

## 2018-04-06 ENCOUNTER — Ambulatory Visit
Admission: RE | Admit: 2018-04-06 | Discharge: 2018-04-06 | Disposition: A | Discharge status: Home / Self Care | Payer: Medicare Other | Source: Ambulatory Visit | Attending: Neurological Surgery | Admitting: Neurological Surgery

## 2018-04-06 VITALS — BP 121/71 | HR 78

## 2018-04-06 DIAGNOSIS — S32009K Unspecified fracture of unspecified lumbar vertebra, subsequent encounter for fracture with nonunion: Secondary | ICD-10-CM

## 2018-04-06 DIAGNOSIS — M47812 Spondylosis without myelopathy or radiculopathy, cervical region: Secondary | ICD-10-CM

## 2018-04-06 DIAGNOSIS — M5416 Radiculopathy, lumbar region: Secondary | ICD-10-CM

## 2018-04-06 DIAGNOSIS — Z981 Arthrodesis status: Secondary | ICD-10-CM

## 2018-04-06 DIAGNOSIS — M542 Cervicalgia: Secondary | ICD-10-CM

## 2018-04-06 DIAGNOSIS — M961 Postlaminectomy syndrome, not elsewhere classified: Secondary | ICD-10-CM

## 2018-04-06 DIAGNOSIS — Z9889 Other specified postprocedural states: Secondary | ICD-10-CM

## 2018-04-06 DIAGNOSIS — M5137 Other intervertebral disc degeneration, lumbosacral region: Secondary | ICD-10-CM

## 2018-04-06 DIAGNOSIS — M5412 Radiculopathy, cervical region: Secondary | ICD-10-CM

## 2018-04-06 DIAGNOSIS — M51379 Other intervertebral disc degeneration, lumbosacral region without mention of lumbar back pain or lower extremity pain: Secondary | ICD-10-CM

## 2018-04-06 MED ORDER — IOPAMIDOL (ISOVUE-M 300) INJECTION 61%
10.0000 mL | Freq: Once | INTRAMUSCULAR | Status: AC | PRN
  Administered 2018-04-06: 10 mL via INTRATHECAL

## 2018-04-06 MED ORDER — DIAZEPAM 5 MG PO TABS
10.0000 mg | ORAL_TABLET | Freq: Once | ORAL | Status: AC
  Administered 2018-04-06: 10 mg via ORAL

## 2018-04-06 MED ORDER — ONDANSETRON HCL 4 MG/2ML IJ SOLN: 4.0000 mg | Freq: Four times a day (QID) | INTRAMUSCULAR | Status: DC | PRN

## 2018-04-06 NOTE — Discharge Instructions (Signed)
Myelogram Discharge Instructions  1. Go home and rest quietly for the next 24 hours.  It is important to lie flat for the next 24 hours.  Get up only to go to the restroom.  You may lie in the bed or on a couch on your back, your stomach, your left side or your right side.  You may have one pillow under your head.  You may have pillows between your knees while you are on your side or under your knees while you are on your back.  2. DO NOT drive today.  Recline the seat as far back as it will go, while still wearing your seat belt, on the way home.  3. You may get up to go to the bathroom as needed.  You may sit up for 10 minutes to eat.  You may resume your normal diet and medications unless otherwise indicated.  Drink lots of extra fluids today and tomorrow.  4. The incidence of headache, nausea, or vomiting is about 5% (one in 20 patients).  If you develop a headache, lie flat and drink plenty of fluids until the headache goes away.  Caffeinated beverages may be helpful.  If you develop severe nausea and vomiting or a headache that does not go away with flat bed rest, call [phone].  5. You may resume normal activities after your 24 hours of bed rest is over; however, do not exert yourself strongly or do any heavy lifting tomorrow. If when you get up you have a headache when standing, go back to bed and force fluids for another 24 hours.  6. Call your physician for a follow-up appointment.  The results of your myelogram will be sent directly to your physician by the following day.  7. If you have any questions or if complications develop after you arrive home, please call [phone].  Discharge instructions have been explained to the patient.  The patient, or the person responsible for the patient, fully understands these instructions.   YOU MAY RESTART YOUR AMITRIPTYLLINE TOMORROW 04/07/2018 AT 1:00PM.

## 2018-04-18 ENCOUNTER — Other Ambulatory Visit: Payer: Self-pay | Admitting: Neurological Surgery

## 2018-04-18 DIAGNOSIS — M533 Sacrococcygeal disorders, not elsewhere classified: Secondary | ICD-10-CM

## 2018-04-26 ENCOUNTER — Ambulatory Visit
Admission: RE | Admit: 2018-04-26 | Discharge: 2018-04-26 | Disposition: A | Discharge status: Home / Self Care | Payer: Medicare Other | Source: Ambulatory Visit | Attending: Neurological Surgery | Admitting: Neurological Surgery

## 2018-04-26 DIAGNOSIS — M533 Sacrococcygeal disorders, not elsewhere classified: Secondary | ICD-10-CM

## 2018-04-26 MED ORDER — IOPAMIDOL (ISOVUE-M 200) INJECTION 41%
1.0000 mL | Freq: Once | INTRAMUSCULAR | Status: AC
  Administered 2018-04-26: 1 mL via INTRA_ARTICULAR

## 2018-04-26 MED ORDER — METHYLPREDNISOLONE ACETATE 40 MG/ML INJ SUSP (RADIOLOG
120.0000 mg | Freq: Once | INTRAMUSCULAR | Status: AC
  Administered 2018-04-26: 120 mg via INTRA_ARTICULAR

## 2018-05-02 NOTE — Pre-Procedure Instructions (Signed)
Angela Sims  05/02/2018      [facility] [id number] - [place], [place] - [address] AT [facility] [address] [place] [address] Phone: [phone] Fax: [phone]  [id number] [place] Boeh[place]- [place], [place] - [address] AT [facility] OF [address] [place] [address] Phone: [phone] Fax: [phone]    Your procedure is scheduled on September 6  [facility].M.  Call this number if you have problems the morning of surgery:  [phone]   Remember:  Do not eat or drink after midnight.      Take these medicines the morning of surgery with A SIP OF WATER  albuterol (PROVENTIL HFA;VENTOLIN HFA)  fluticasone (FLONASE)  Norethindrone Acetate-Ethinyl Estrad-FE (JUNEL FE 24)   7 days prior to surgery STOP taking any Aspirin(unless otherwise instructed by your surgeon), Aleve, Naproxen, Ibuprofen, Motrin, Advil, Goody's, BC's, all herbal medications, fish oil, and all vitamins, meloxicam (MOBIC)     Do not wear jewelry, make-up or nail polish.  Do not wear lotions, powders, or perfumes, or deodorant.  Do not shave 48 hours prior to surgery.    Do not bring valuables to the hospital.  [facility] any belongings or valuables.  Contacts, dentures or bridgework may not be worn into surgery.  Leave your suitcase in the car.  After surgery it may be brought to your room.  For patients admitted to the hospital, discharge time will be determined by your treatment team.  Patients discharged the day of surgery will not be allowed to drive home.    Special instructions:   [facility]- Preparing For Surgery  Before surgery, you can play an important role. Because skin is not sterile, your skin needs to be as free of germs as possible. You can reduce the number of germs on your skin by washing with CHG (chlorahexidine gluconate) Soap before surgery.  CHG  is an antiseptic cleaner which kills germs and bonds with the skin to continue killing germs even after washing.    Oral Hygiene is also important to reduce your risk of infection.  Remember - BRUSH YOUR TEETH THE MORNING OF SURGERY WITH YOUR REGULAR TOOTHPASTE  Please do not use if you have an allergy to CHG or antibacterial soaps. If your skin becomes reddened/irritated stop using the CHG.  Do not shave (including legs and underarms) for at least 48 hours prior to first CHG shower. It is OK to shave your face.  Please follow these instructions carefully.   1. Shower the NIGHT BEFORE SURGERY and the MORNING OF SURGERY with CHG.   2. If you chose to wash your hair, wash your hair first as usual with your normal shampoo.  3. After you shampoo, rinse your hair and body thoroughly to remove the shampoo.  4. Use CHG as you would any other liquid soap. You can apply CHG directly to the skin and wash gently with a scrungie or a clean washcloth.   5. Apply the CHG Soap to your body ONLY FROM THE NECK DOWN.  Do not use on open wounds or open sores. Avoid contact with your eyes, ears, mouth and genitals (private parts). Wash Face and genitals (private parts)  with your normal soap.  6. Wash thoroughly, paying special attention to the area where your surgery will be performed.  7. Thoroughly rinse your  body with warm water from the neck down.  8. DO NOT shower/wash with your normal soap after using and rinsing off the CHG Soap.  9. Pat yourself dry with a CLEAN TOWEL.  10. Wear CLEAN PAJAMAS to bed the night before surgery, wear comfortable clothes the morning of surgery  11. Place CLEAN SHEETS on your bed the night of your first shower and DO NOT SLEEP WITH PETS.    Day of Surgery:  Do not apply any deodorants/lotions.  Please wear clean clothes to the hospital/surgery center.   Remember to brush your teeth WITH YOUR REGULAR TOOTHPASTE.    Please read over the following fact sheets  that you were given.

## 2018-05-03 ENCOUNTER — Ambulatory Visit (HOSPITAL_COMMUNITY)
Admission: RE | Admit: 2018-05-03 | Discharge: 2018-05-03 | Disposition: A | Discharge status: Home / Self Care | Payer: Medicare Other | Source: Ambulatory Visit | Attending: Neurological Surgery | Admitting: Neurological Surgery

## 2018-05-03 ENCOUNTER — Encounter (HOSPITAL_COMMUNITY): Payer: Self-pay

## 2018-05-03 ENCOUNTER — Encounter (HOSPITAL_COMMUNITY)
Admission: RE | Admit: 2018-05-03 | Discharge: 2018-05-03 | Disposition: A | Discharge status: Home / Self Care | Payer: Medicare Other | Source: Ambulatory Visit | Attending: Neurological Surgery | Admitting: Neurological Surgery

## 2018-05-03 ENCOUNTER — Other Ambulatory Visit: Payer: Self-pay

## 2018-05-03 DIAGNOSIS — M960 Pseudarthrosis after fusion or arthrodesis: Secondary | ICD-10-CM | POA: Diagnosis not present

## 2018-05-03 DIAGNOSIS — Z01818 Encounter for other preprocedural examination: Secondary | ICD-10-CM | POA: Insufficient documentation

## 2018-05-03 DIAGNOSIS — E7800 Pure hypercholesterolemia, unspecified: Secondary | ICD-10-CM | POA: Insufficient documentation

## 2018-05-03 DIAGNOSIS — D72829 Elevated white blood cell count, unspecified: Secondary | ICD-10-CM | POA: Diagnosis not present

## 2018-05-03 DIAGNOSIS — Z791 Long term (current) use of non-steroidal anti-inflammatories (NSAID): Secondary | ICD-10-CM | POA: Insufficient documentation

## 2018-05-03 DIAGNOSIS — M1990 Unspecified osteoarthritis, unspecified site: Secondary | ICD-10-CM | POA: Insufficient documentation

## 2018-05-03 DIAGNOSIS — Z0183 Encounter for blood typing: Secondary | ICD-10-CM | POA: Insufficient documentation

## 2018-05-03 DIAGNOSIS — S32009K Unspecified fracture of unspecified lumbar vertebra, subsequent encounter for fracture with nonunion: Secondary | ICD-10-CM

## 2018-05-03 DIAGNOSIS — Z79899 Other long term (current) drug therapy: Secondary | ICD-10-CM | POA: Diagnosis not present

## 2018-05-03 DIAGNOSIS — Z01810 Encounter for preprocedural cardiovascular examination: Secondary | ICD-10-CM | POA: Insufficient documentation

## 2018-05-03 DIAGNOSIS — Z7951 Long term (current) use of inhaled steroids: Secondary | ICD-10-CM | POA: Insufficient documentation

## 2018-05-03 DIAGNOSIS — G2581 Restless legs syndrome: Secondary | ICD-10-CM | POA: Diagnosis not present

## 2018-05-03 DIAGNOSIS — Z01812 Encounter for preprocedural laboratory examination: Secondary | ICD-10-CM | POA: Diagnosis not present

## 2018-05-03 DIAGNOSIS — M797 Fibromyalgia: Secondary | ICD-10-CM | POA: Insufficient documentation

## 2018-05-03 HISTORY — DX: Unspecified fracture of unspecified lumbar vertebra, subsequent encounter for fracture with nonunion: S32.009K

## 2018-05-03 LAB — CBC WITH DIFFERENTIAL/PLATELET
ABS IMMATURE GRANULOCYTES: 0.1 10*3/uL (ref 0.0–0.1)
BASOS PCT: 0 %
Basophils Absolute: 0.1 10*3/uL (ref 0.0–0.1)
Eosinophils Absolute: 0 10*3/uL (ref 0.0–0.7)
Eosinophils Relative: 0 %
HEMATOCRIT: 44.5 % (ref 36.0–46.0)
HEMOGLOBIN: 14 g/dL (ref 12.0–15.0)
Immature Granulocytes: 1 %
LYMPHS ABS: 3.6 10*3/uL (ref 0.7–4.0)
Lymphocytes Relative: 25 %
MCH: 29.1 pg (ref 26.0–34.0)
MCHC: 31.5 g/dL (ref 30.0–36.0)
MCV: 92.5 fL (ref 78.0–100.0)
MONO ABS: 1 10*3/uL (ref 0.1–1.0)
MONOS PCT: 7 %
NEUTROS ABS: 10 10*3/uL — AB (ref 1.7–7.7)
Neutrophils Relative %: 67 %
PLATELETS: 407 10*3/uL — AB (ref 150–400)
RBC: 4.81 MIL/uL (ref 3.87–5.11)
RDW: 12.8 % (ref 11.5–15.5)
WBC: 14.7 10*3/uL — ABNORMAL HIGH (ref 4.0–10.5)

## 2018-05-03 LAB — TYPE AND SCREEN
ABO/RH(D): O POS
ANTIBODY SCREEN: NEGATIVE

## 2018-05-03 LAB — BASIC METABOLIC PANEL
Anion gap: 11 (ref 5–15)
BUN: 18 mg/dL (ref 6–20)
CALCIUM: 9.6 mg/dL (ref 8.9–10.3)
CO2: 23 mmol/L (ref 22–32)
Chloride: 105 mmol/L (ref 98–111)
Creatinine, Ser: 0.81 mg/dL (ref 0.44–1.00)
GFR calc Af Amer: >60 mL/min (ref >60)
GFR calc non Af Amer: >60 mL/min (ref >60)
GLUCOSE: 84 mg/dL (ref 70–99)
Potassium: 4.5 mmol/L (ref 3.5–5.1)
Sodium: 139 mmol/L (ref 135–145)

## 2018-05-03 LAB — PROTIME-INR
INR: 1.01
Prothrombin Time: 13.2 seconds (ref 11.4–15.2)

## 2018-05-03 LAB — SURGICAL PCR SCREEN
MRSA, PCR: NEGATIVE
Staphylococcus aureus: NEGATIVE

## 2018-05-03 NOTE — Progress Notes (Signed)
Pt denies SOB, chest pain, and being under the care of a cardiologist. Pt denies having a stress test, echo and cardiac cath. Pt denies having an EKG and chest x ray within the last year. Pt denies recent labs. Pt chart forwarded to anesthesia to review abnormal labs  ( WBC 14.7, Neut # 10.0 ).

## 2018-05-03 NOTE — Pre-Procedure Instructions (Signed)
Angela Sims  05/03/2018     [facility] [id number] - [place], [place] - [address] AT [facility] [address] [address] [place] [address] Phone: [phone] Fax: [phone]  [id number] Vilinda Boehringer, [place] - [address] AT [facility] OF [address] & [address] [place] [address] Phone: [phone] Fax: [phone]   Your procedure is scheduled on Friday, May 12, 2018  Report to [facility] Admitting at 9:30 A.M.  Call this number if you have problems the morning of surgery:  [phone]   Remember:  Do not eat or drink after midnight Thursday, May 11, 2018  Take these medicines the morning of surgery with A SIP OF WATER : hydroxychloroquine (PLAQUENIL), Norethindrone Acetate-Ethinyl Estrad-FE  If needed: fluticasone (FLONASE)  nasal spray for allergies, albuterol (PROVENTIL HFA;VENTOLIN HFA)  Inhaler for wheezing or shortness of breath ( Bring inhaler in with you on day of surgery).  Stop taking Aspirin ( unless advised otherwise by you surgeon), vitamins, fish oil and herbal medications. Do not take any NSAIDs ie: Ibuprofen, Advil, Naproxen (Alev), Motrin, Meloxicam (Mobic), BC and Goody Powder; stop Friday May 05, 2018.  Do not wear jewelry, make-up or nail polish.  Do not wear lotions, powders, or perfumes, or deodorant.  Do not shave 48 hours prior to surgery.    Do not bring valuables to the hospital.  [facility] is not responsible for any belongings or valuables.  Contacts, dentures or bridgework may not be worn into surgery.  Leave your suitcase in the car.  After surgery it may be brought to your room. Patients discharged the day of surgery will not be allowed to drive home.   Special Instructions: [place] - Preparing for Surgery  Before surgery, you can play an important role.  Because skin is not sterile, your skin needs to be as free of germs as possible.  You can reduce the number of  germs on you skin by washing with CHG (chlorahexidine gluconate) soap before surgery.  CHG is an antiseptic cleaner which kills germs and bonds with the skin to continue killing germs even after washing.  Please DO NOT use if you have an allergy to CHG or antibacterial soaps.  If your skin becomes reddened/irritated stop using the CHG and inform your nurse when you arrive at Short Stay.  Do not shave (including legs and underarms) for at least 48 hours prior to the first CHG shower.  You may shave your face.  Please follow these instructions carefully:   1.  Shower with CHG Soap the night before surgery and the morning of Surgery.  2.  If you choose to wash your hair, wash your hair first as usual with your normal shampoo.  3.  After you shampoo, rinse your hair and body thoroughly to remove the Shampoo.  4.  Use CHG as you would any other liquid soap.  You can apply chg directly  to the skin and wash gently with scrungie or a clean washcloth.  5.  Apply the CHG Soap to your body ONLY FROM THE NECK DOWN.  Do not use on open wounds or open sores.  Avoid contact with your eyes, ears, mouth and genitals (private parts).  Wash genitals (private parts) with your normal soap.  6.  Wash thoroughly, paying special attention to the area where your surgery will be performed.  7.  Thoroughly rinse your body with warm  water from the neck down.  8.  DO NOT shower/wash with your normal soap after using and rinsing off the CHG Soap.  9.  Pat yourself dry with a clean towel.            10.  Wear clean pajamas.            11.  Place clean sheets on your bed the night of your first shower and do not sleep with pets.  Day of Surgery  Do not apply any lotions/deoderants the morning of surgery.  Please wear clean clothes to the hospital/surgery center. Please read over the following fact sheets that you were given. Pain Booklet, Coughing and Deep Breathing, MRSA Information and Surgical Site Infection  Prevention

## 2018-05-04 NOTE — Progress Notes (Signed)
Anesthesia Chart Review:  Case:  [id number] Date/Time:  05/12/18 1117   Procedure:  Posterior lateral fusion - L5-S1 with non-segmental fixation (N/A Back)   Anesthesia type:  General   Pre-op diagnosis:  Pseudoarthrosis   Location:  [facility]OM 18 / [facility]   Surgeon:  Tia Alert, MD      DISCUSSION: 47 yo female never smoker for above procedure. Pertinent hx includes PONV, Fibromyalgia, Arthritis. S/p Lumbar laminectomy 06/16/2015, S/p Lumbar fusion 12/05/2015, S/p Cervical disc arthroplasty 01/22/2016.  Asked to review chart due to mild leukocytosis with WBC 14.7. Review of previous labs shows pt consistently with mild leukocytosis. WBC (K/uL)  Date Value  05/03/2018 14.7 (H)  01/13/2016 14.4 (H)  12/05/2015 16.1 (H)  11/26/2015 19.2 (H)  06/19/2015 13.0 (H)   Anticipate she can proceed with surgery as planned barring acute status change.  VS: BP 122/71   Pulse 71   Temp 36.8 C   Resp 20   Ht 5\' 2"  (1.575 m)   Wt 58.9 kg   LMP 04/19/2018 (Exact Date)   SpO2 100%   BMI 23.76 kg/m   PROVIDERS: Raelyn Number, MD is PCP  LABS: Labs reviewed: Acceptable for surgery.  (all labs ordered are listed, but only abnormal results are displayed)  Labs Reviewed  CBC WITH DIFFERENTIAL/PLATELET - Abnormal; Notable for the following components:      Result Value   WBC 14.7 (*)    Platelets 407 (*)    Neutro Abs 10.0 (*)    All other components within normal limits  SURGICAL PCR SCREEN  BASIC METABOLIC PANEL  PROTIME-INR  TYPE AND SCREEN     IMAGES: CHEST - 2 VIEW 05/03/2018  COMPARISON:  None.  FINDINGS: The heart size and mediastinal contours are within normal limits. Both lungs are clear. The visualized skeletal structures are unremarkable.  IMPRESSION: No active cardiopulmonary disease.   EKG: 05/03/2018: Normal sinus rhythm   CV: N/A  Past Medical History:  Diagnosis Date  . Complication of anesthesia   . Deviated septum    right side  .  Fibromyalgia   . Herniated cervical disc 12/02/15  . High cholesterol   . Myofascial pain syndrome   . PONV (postoperative nausea and vomiting) 12/05/15  . Pseudoarthrosis of lumbar spine   . Restless leg syndrome   . Seasonal allergies     Past Surgical History:  Procedure Laterality Date  . ABDOMINAL EXPOSURE N/A 12/05/2015   Procedure: ABDOMINAL EXPOSURE;  Surgeon: Larina Earthly, MD;  Location: [facility];  Service: Vascular;  Laterality: N/A;  . ANTERIOR LUMBAR FUSION N/A 12/05/2015   Procedure: ANTERIOR LUMBAR FUSION LUMBAR FIVE-SACRAL ONE ;  Surgeon: Tia Alert, MD;  Location: [facility];  Service: Neurosurgery;  Laterality: N/A;  Abdominal approach  . CERVICAL DISC ARTHROPLASTY N/A 01/22/2016   Procedure: C5-6 Cervical artificial disc replacment;  Surgeon: Tia Alert, MD;  Location: [facility];  Service: Neurosurgery;  Laterality: N/A;  C5-6 Cervical artificial disc replacment  . LUMBAR FUSION  12/05/2015  . LUMBAR LAMINECTOMY/DECOMPRESSION MICRODISCECTOMY Right 06/25/2015   Procedure: LUMBAR LAMINECTOMY/DECOMPRESSION MICRODISCECTOMY 1 LEVEL; RIGHT ;  Surgeon: Tia Alert, MD;  Location: [facility];  Service: Neurosurgery;  Laterality: Right;  . NO PAST SURGERIES    . WISDOM TOOTH EXTRACTION      MEDICATIONS: . albuterol (PROVENTIL HFA;VENTOLIN HFA) 108 (90 BASE) MCG/ACT inhaler  . amitriptyline (ELAVIL) 25 MG tablet  . fluticasone (FLONASE) 50  MCG/ACT nasal spray  . hydroxychloroquine (PLAQUENIL) 200 MG tablet  . meloxicam (MOBIC) 15 MG tablet  . Norethindrone Acetate-Ethinyl Estrad-FE (JUNEL FE 24) 1-20 MG-MCG(24) tablet   No current facility-administered medications for this encounter.     Zannie CoveJames Anani Gu, PA-C [facility] Phone [phone] 05/04/2018 1:40 PM

## 2018-05-12 ENCOUNTER — Inpatient Hospital Stay (HOSPITAL_COMMUNITY)
Admission: RE | Admit: 2018-05-12 | Discharge: 2018-05-12 | DRG: 460 | Disposition: A | Discharge status: Home / Self Care | Payer: Medicare Other | Source: Ambulatory Visit | Attending: Neurological Surgery | Admitting: Neurological Surgery

## 2018-05-12 ENCOUNTER — Encounter (HOSPITAL_COMMUNITY): Payer: Self-pay | Admitting: Certified Registered Nurse Anesthetist

## 2018-05-12 ENCOUNTER — Inpatient Hospital Stay (HOSPITAL_COMMUNITY)
Admission: RE | Disposition: A | Discharge status: Home / Self Care | Payer: Self-pay | Source: Ambulatory Visit | Attending: Neurological Surgery

## 2018-05-12 ENCOUNTER — Inpatient Hospital Stay (HOSPITAL_COMMUNITY): Payer: Medicare Other

## 2018-05-12 ENCOUNTER — Inpatient Hospital Stay (HOSPITAL_COMMUNITY): Payer: Medicare Other | Admitting: Physician Assistant

## 2018-05-12 ENCOUNTER — Inpatient Hospital Stay (HOSPITAL_COMMUNITY): Payer: Medicare Other | Admitting: Certified Registered Nurse Anesthetist

## 2018-05-12 DIAGNOSIS — G2581 Restless legs syndrome: Secondary | ICD-10-CM | POA: Diagnosis present

## 2018-05-12 DIAGNOSIS — M797 Fibromyalgia: Secondary | ICD-10-CM | POA: Diagnosis present

## 2018-05-12 DIAGNOSIS — Z791 Long term (current) use of non-steroidal anti-inflammatories (NSAID): Secondary | ICD-10-CM

## 2018-05-12 DIAGNOSIS — M960 Pseudarthrosis after fusion or arthrodesis: Secondary | ICD-10-CM | POA: Diagnosis not present

## 2018-05-12 DIAGNOSIS — Z880 Allergy status to penicillin: Secondary | ICD-10-CM

## 2018-05-12 DIAGNOSIS — E739 Lactose intolerance, unspecified: Secondary | ICD-10-CM | POA: Diagnosis present

## 2018-05-12 DIAGNOSIS — Z811 Family history of alcohol abuse and dependence: Secondary | ICD-10-CM

## 2018-05-12 DIAGNOSIS — Z419 Encounter for procedure for purposes other than remedying health state, unspecified: Secondary | ICD-10-CM

## 2018-05-12 DIAGNOSIS — Z981 Arthrodesis status: Secondary | ICD-10-CM | POA: Diagnosis not present

## 2018-05-12 DIAGNOSIS — M7918 Myalgia, other site: Secondary | ICD-10-CM | POA: Diagnosis present

## 2018-05-12 DIAGNOSIS — E7800 Pure hypercholesterolemia, unspecified: Secondary | ICD-10-CM | POA: Diagnosis present

## 2018-05-12 DIAGNOSIS — Z8052 Family history of malignant neoplasm of bladder: Secondary | ICD-10-CM

## 2018-05-12 DIAGNOSIS — Z7951 Long term (current) use of inhaled steroids: Secondary | ICD-10-CM

## 2018-05-12 DIAGNOSIS — Z888 Allergy status to other drugs, medicaments and biological substances status: Secondary | ICD-10-CM

## 2018-05-12 HISTORY — PX: LAMINECTOMY WITH POSTERIOR LATERAL ARTHRODESIS LEVEL 1: SHX6335

## 2018-05-12 LAB — POCT PREGNANCY, URINE: Preg Test, Ur: NEGATIVE

## 2018-05-12 SURGERY — LAMINECTOMY WITH POSTERIOR LATERAL ARTHRODESIS LEVEL 1
Anesthesia: General | Site: Back

## 2018-05-12 MED ORDER — FENTANYL CITRATE (PF) 100 MCG/2ML IJ SOLN
INTRAMUSCULAR | Status: DC | PRN
  Administered 2018-05-12 (×3): 50 ug via INTRAVENOUS

## 2018-05-12 MED ORDER — ONDANSETRON HCL 4 MG/2ML IJ SOLN
INTRAMUSCULAR | Status: AC
  Filled 2018-05-12: qty 2

## 2018-05-12 MED ORDER — FENTANYL CITRATE (PF) 100 MCG/2ML IJ SOLN
INTRAMUSCULAR | Status: AC
  Filled 2018-05-12: qty 2

## 2018-05-12 MED ORDER — LACTATED RINGERS IV SOLN: INTRAVENOUS | Status: DC

## 2018-05-12 MED ORDER — ONDANSETRON HCL 4 MG PO TABS: 4.0000 mg | ORAL_TABLET | Freq: Four times a day (QID) | ORAL | Status: DC | PRN

## 2018-05-12 MED ORDER — HEPARIN SODIUM (PORCINE) 1000 UNIT/ML IJ SOLN
INTRAMUSCULAR | Status: AC
  Filled 2018-05-12: qty 1

## 2018-05-12 MED ORDER — ARTIFICIAL TEARS OPHTHALMIC OINT
TOPICAL_OINTMENT | OPHTHALMIC | Status: AC
  Filled 2018-05-12: qty 3.5

## 2018-05-12 MED ORDER — HYDROCODONE-ACETAMINOPHEN 7.5-325 MG PO TABS
1.0000 | ORAL_TABLET | ORAL | Status: DC | PRN
  Administered 2018-05-12: 1 via ORAL

## 2018-05-12 MED ORDER — LIDOCAINE 2% (20 MG/ML) 5 ML SYRINGE
INTRAMUSCULAR | Status: AC
  Filled 2018-05-12: qty 5

## 2018-05-12 MED ORDER — METHOCARBAMOL 1000 MG/10ML IJ SOLN
500.0000 mg | Freq: Four times a day (QID) | INTRAVENOUS | Status: DC | PRN
  Filled 2018-05-12: qty 5

## 2018-05-12 MED ORDER — POTASSIUM CHLORIDE IN NACL 20-0.9 MEQ/L-% IV SOLN: INTRAVENOUS | Status: DC

## 2018-05-12 MED ORDER — HYDROXYCHLOROQUINE SULFATE 200 MG PO TABS
200.0000 mg | ORAL_TABLET | Freq: Two times a day (BID) | ORAL | Status: DC
  Filled 2018-05-12: qty 1

## 2018-05-12 MED ORDER — AMITRIPTYLINE HCL 25 MG PO TABS: 25.0000 mg | ORAL_TABLET | Freq: Every day | ORAL | Status: DC

## 2018-05-12 MED ORDER — CELECOXIB 200 MG PO CAPS
200.0000 mg | ORAL_CAPSULE | Freq: Two times a day (BID) | ORAL | Status: DC
  Administered 2018-05-12: 200 mg via ORAL
  Filled 2018-05-12: qty 1

## 2018-05-12 MED ORDER — BUPIVACAINE HCL (PF) 0.25 % IJ SOLN
INTRAMUSCULAR | Status: DC | PRN
  Administered 2018-05-12: 8 mL

## 2018-05-12 MED ORDER — HYDROCODONE-ACETAMINOPHEN 7.5-325 MG PO TABS: 1.0000 | ORAL_TABLET | ORAL | 0 refills | Status: DC | PRN

## 2018-05-12 MED ORDER — THROMBIN 20000 UNITS EX KIT
PACK | CUTANEOUS | Status: AC
  Filled 2018-05-12: qty 1

## 2018-05-12 MED ORDER — ACETAMINOPHEN 650 MG RE SUPP: 650.0000 mg | RECTAL | Status: DC | PRN

## 2018-05-12 MED ORDER — ACETAMINOPHEN 10 MG/ML IV SOLN
INTRAVENOUS | Status: AC
  Filled 2018-05-12: qty 100

## 2018-05-12 MED ORDER — ACETAMINOPHEN 10 MG/ML IV SOLN
INTRAVENOUS | Status: DC | PRN
  Administered 2018-05-12: 1000 mg via INTRAVENOUS

## 2018-05-12 MED ORDER — HYDROMORPHONE HCL 1 MG/ML IJ SOLN
0.5000 mg | INTRAMUSCULAR | Status: DC | PRN
  Administered 2018-05-12: 0.5 mg via INTRAVENOUS
  Filled 2018-05-12: qty 0.5

## 2018-05-12 MED ORDER — ONDANSETRON HCL 4 MG/2ML IJ SOLN
INTRAMUSCULAR | Status: DC | PRN
  Administered 2018-05-12: 4 mg via INTRAVENOUS

## 2018-05-12 MED ORDER — THROMBIN 20000 UNITS EX SOLR
CUTANEOUS | Status: DC | PRN
  Administered 2018-05-12: 20 mL via TOPICAL

## 2018-05-12 MED ORDER — SODIUM CHLORIDE 0.9% FLUSH: 3.0000 mL | INTRAVENOUS | Status: DC | PRN

## 2018-05-12 MED ORDER — PROPOFOL 10 MG/ML IV BOLUS
INTRAVENOUS | Status: AC
  Filled 2018-05-12: qty 20

## 2018-05-12 MED ORDER — PHENOL 1.4 % MT LIQD: 1.0000 | OROMUCOSAL | Status: DC | PRN

## 2018-05-12 MED ORDER — METOCLOPRAMIDE HCL 5 MG/ML IJ SOLN
10.0000 mg | Freq: Once | INTRAMUSCULAR | Status: AC | PRN
  Administered 2018-05-12: 10 mg via INTRAVENOUS

## 2018-05-12 MED ORDER — CHLORHEXIDINE GLUCONATE CLOTH 2 % EX PADS: 6.0000 | MEDICATED_PAD | Freq: Once | CUTANEOUS | Status: DC

## 2018-05-12 MED ORDER — METHOCARBAMOL 500 MG PO TABS
500.0000 mg | ORAL_TABLET | Freq: Four times a day (QID) | ORAL | Status: DC | PRN
  Administered 2018-05-12: 500 mg via ORAL

## 2018-05-12 MED ORDER — SODIUM CHLORIDE 0.9 % IV SOLN: 250.0000 mL | INTRAVENOUS | Status: DC

## 2018-05-12 MED ORDER — MIDAZOLAM HCL 2 MG/2ML IJ SOLN
INTRAMUSCULAR | Status: AC
  Filled 2018-05-12: qty 2

## 2018-05-12 MED ORDER — ONDANSETRON HCL 4 MG/2ML IJ SOLN: 4.0000 mg | Freq: Four times a day (QID) | INTRAMUSCULAR | Status: DC | PRN

## 2018-05-12 MED ORDER — MENTHOL 3 MG MT LOZG: 1.0000 | LOZENGE | OROMUCOSAL | Status: DC | PRN

## 2018-05-12 MED ORDER — MEPERIDINE HCL 50 MG/ML IJ SOLN: 6.2500 mg | INTRAMUSCULAR | Status: DC | PRN

## 2018-05-12 MED ORDER — ROCURONIUM BROMIDE 50 MG/5ML IV SOSY
PREFILLED_SYRINGE | INTRAVENOUS | Status: DC | PRN
  Administered 2018-05-12: 50 mg via INTRAVENOUS

## 2018-05-12 MED ORDER — VANCOMYCIN HCL 1000 MG IV SOLR
INTRAVENOUS | Status: AC
  Filled 2018-05-12: qty 1000

## 2018-05-12 MED ORDER — NORETHIN ACE-ETH ESTRAD-FE 1-20 MG-MCG(24) PO TABS: 1.0000 | ORAL_TABLET | Freq: Every day | ORAL | Status: DC

## 2018-05-12 MED ORDER — LIDOCAINE 2% (20 MG/ML) 5 ML SYRINGE
INTRAMUSCULAR | Status: DC | PRN
  Administered 2018-05-12: 100 mg via INTRAVENOUS

## 2018-05-12 MED ORDER — PROPOFOL 10 MG/ML IV BOLUS
INTRAVENOUS | Status: DC | PRN
  Administered 2018-05-12: 160 mg via INTRAVENOUS

## 2018-05-12 MED ORDER — ALBUTEROL SULFATE (2.5 MG/3ML) 0.083% IN NEBU: 3.0000 mL | INHALATION_SOLUTION | Freq: Every day | RESPIRATORY_TRACT | Status: DC | PRN

## 2018-05-12 MED ORDER — THROMBIN 5000 UNITS EX SOLR
CUTANEOUS | Status: AC
  Filled 2018-05-12: qty 5000

## 2018-05-12 MED ORDER — THROMBIN 5000 UNITS EX SOLR
OROMUCOSAL | Status: DC | PRN
  Administered 2018-05-12: 5 mL via TOPICAL

## 2018-05-12 MED ORDER — SODIUM CHLORIDE 0.9 % IV SOLN
INTRAVENOUS | Status: DC | PRN
  Administered 2018-05-12: 500 mL

## 2018-05-12 MED ORDER — HEPARIN SODIUM (PORCINE) 1000 UNIT/ML IJ SOLN
INTRAMUSCULAR | Status: DC | PRN
  Administered 2018-05-12: 5000 [IU]

## 2018-05-12 MED ORDER — FENTANYL CITRATE (PF) 100 MCG/2ML IJ SOLN
25.0000 ug | INTRAMUSCULAR | Status: DC | PRN
  Administered 2018-05-12 (×2): 50 ug via INTRAVENOUS

## 2018-05-12 MED ORDER — ONDANSETRON HCL 4 MG PO TABS: 4.0000 mg | ORAL_TABLET | Freq: Three times a day (TID) | ORAL | 0 refills | Status: DC | PRN

## 2018-05-12 MED ORDER — HYDROXYZINE HCL 50 MG/ML IM SOLN
50.0000 mg | Freq: Four times a day (QID) | INTRAMUSCULAR | Status: DC | PRN
  Administered 2018-05-12: 50 mg via INTRAMUSCULAR
  Filled 2018-05-12: qty 1

## 2018-05-12 MED ORDER — VANCOMYCIN HCL 1000 MG IV SOLR
INTRAVENOUS | Status: DC | PRN
  Administered 2018-05-12: 1000 mg

## 2018-05-12 MED ORDER — SENNA 8.6 MG PO TABS: 1.0000 | ORAL_TABLET | Freq: Two times a day (BID) | ORAL | Status: DC

## 2018-05-12 MED ORDER — FENTANYL CITRATE (PF) 250 MCG/5ML IJ SOLN
INTRAMUSCULAR | Status: AC
  Filled 2018-05-12: qty 5

## 2018-05-12 MED ORDER — VANCOMYCIN HCL IN DEXTROSE 1-5 GM/200ML-% IV SOLN
1000.0000 mg | INTRAVENOUS | Status: AC
  Administered 2018-05-12: 1000 mg via INTRAVENOUS

## 2018-05-12 MED ORDER — ARTIFICIAL TEARS OPHTHALMIC OINT
TOPICAL_OINTMENT | OPHTHALMIC | Status: DC | PRN
  Administered 2018-05-12: 1 via OPHTHALMIC

## 2018-05-12 MED ORDER — VANCOMYCIN HCL IN DEXTROSE 750-5 MG/150ML-% IV SOLN
750.0000 mg | Freq: Once | INTRAVENOUS | Status: DC
  Filled 2018-05-12: qty 150

## 2018-05-12 MED ORDER — METHOCARBAMOL 500 MG PO TABS
ORAL_TABLET | ORAL | Status: AC
  Filled 2018-05-12: qty 1

## 2018-05-12 MED ORDER — ACETAMINOPHEN 325 MG PO TABS: 650.0000 mg | ORAL_TABLET | ORAL | Status: DC | PRN

## 2018-05-12 MED ORDER — METOCLOPRAMIDE HCL 5 MG/ML IJ SOLN
INTRAMUSCULAR | Status: AC
  Filled 2018-05-12: qty 2

## 2018-05-12 MED ORDER — MIDAZOLAM HCL 5 MG/5ML IJ SOLN
INTRAMUSCULAR | Status: DC | PRN
  Administered 2018-05-12: 2 mg via INTRAVENOUS

## 2018-05-12 MED ORDER — DEXAMETHASONE SODIUM PHOSPHATE 10 MG/ML IJ SOLN
10.0000 mg | INTRAMUSCULAR | Status: AC
  Administered 2018-05-12: 10 mg via INTRAVENOUS

## 2018-05-12 MED ORDER — VANCOMYCIN HCL IN DEXTROSE 1-5 GM/200ML-% IV SOLN
INTRAVENOUS | Status: AC
  Administered 2018-05-12: 1000 mg via INTRAVENOUS
  Filled 2018-05-12: qty 200

## 2018-05-12 MED ORDER — HYDROCODONE-ACETAMINOPHEN 7.5-325 MG PO TABS
ORAL_TABLET | ORAL | Status: AC
  Filled 2018-05-12: qty 1

## 2018-05-12 MED ORDER — PHENYLEPHRINE 40 MCG/ML (10ML) SYRINGE FOR IV PUSH (FOR BLOOD PRESSURE SUPPORT)
PREFILLED_SYRINGE | INTRAVENOUS | Status: DC | PRN
  Administered 2018-05-12 (×2): 80 ug via INTRAVENOUS

## 2018-05-12 MED ORDER — SODIUM CHLORIDE 0.9% FLUSH
3.0000 mL | Freq: Two times a day (BID) | INTRAVENOUS | Status: DC
  Administered 2018-05-12: 3 mL via INTRAVENOUS

## 2018-05-12 MED ORDER — HEMOSTATIC AGENTS (NO CHARGE) OPTIME
TOPICAL | Status: DC | PRN
  Administered 2018-05-12: 1 via TOPICAL

## 2018-05-12 MED ORDER — LACTATED RINGERS IV SOLN
INTRAVENOUS | Status: DC
  Administered 2018-05-12 (×2): via INTRAVENOUS

## 2018-05-12 MED ORDER — BUPIVACAINE HCL (PF) 0.25 % IJ SOLN
INTRAMUSCULAR | Status: AC
  Filled 2018-05-12: qty 30

## 2018-05-12 MED ORDER — SUGAMMADEX SODIUM 200 MG/2ML IV SOLN
INTRAVENOUS | Status: DC | PRN
  Administered 2018-05-12: 200 mg via INTRAVENOUS

## 2018-05-12 MED ORDER — DEXAMETHASONE SODIUM PHOSPHATE 10 MG/ML IJ SOLN
INTRAMUSCULAR | Status: AC
  Filled 2018-05-12: qty 1

## 2018-05-12 MED ORDER — ROCURONIUM BROMIDE 50 MG/5ML IV SOSY
PREFILLED_SYRINGE | INTRAVENOUS | Status: AC
  Filled 2018-05-12: qty 10

## 2018-05-12 MED ORDER — METHOCARBAMOL 500 MG PO TABS: 500.0000 mg | ORAL_TABLET | Freq: Three times a day (TID) | ORAL | 1 refills | Status: DC | PRN

## 2018-05-12 SURGICAL SUPPLY — 62 items
BAG DECANTER FOR FLEXI CONT (MISCELLANEOUS) ×2 IMPLANT
BASKET BONE COLLECTION (BASKET) ×2 IMPLANT
BENZOIN TINCTURE PRP APPL 2/3 (GAUZE/BANDAGES/DRESSINGS) ×2 IMPLANT
BLADE CLIPPER SURG (BLADE) IMPLANT
BUR MATCHSTICK NEURO 3.0 LAGG (BURR) ×2 IMPLANT
CANISTER SUCT 3000ML PPV (MISCELLANEOUS) ×2 IMPLANT
CARTRIDGE OIL MAESTRO DRILL (MISCELLANEOUS) ×1 IMPLANT
CLSR STERI-STRIP ANTIMIC 1/2X4 (GAUZE/BANDAGES/DRESSINGS) ×2 IMPLANT
CONT SPEC 4OZ CLIKSEAL STRL BL (MISCELLANEOUS) ×2 IMPLANT
COVER BACK TABLE 60X90IN (DRAPES) ×2 IMPLANT
DERMABOND ADVANCED (GAUZE/BANDAGES/DRESSINGS) ×1
DERMABOND ADVANCED .7 DNX12 (GAUZE/BANDAGES/DRESSINGS) ×1 IMPLANT
DIFFUSER DRILL AIR PNEUMATIC (MISCELLANEOUS) ×2 IMPLANT
DRAPE C-ARM 42X72 X-RAY (DRAPES) ×6 IMPLANT
DRAPE LAPAROTOMY 100X72X124 (DRAPES) ×2 IMPLANT
DRAPE POUCH INSTRU U-SHP 10X18 (DRAPES) ×2 IMPLANT
DRAPE SURG 17X23 STRL (DRAPES) ×2 IMPLANT
DRSG OPSITE POSTOP 4X6 (GAUZE/BANDAGES/DRESSINGS) ×2 IMPLANT
DURAPREP 26ML APPLICATOR (WOUND CARE) ×2 IMPLANT
ELECT REM PT RETURN 9FT ADLT (ELECTROSURGICAL) ×2
ELECTRODE REM PT RTRN 9FT ADLT (ELECTROSURGICAL) ×1 IMPLANT
EVACUATOR 1/8 PVC DRAIN (DRAIN) IMPLANT
GAUZE 4X4 16PLY RFD (DISPOSABLE) IMPLANT
GLOVE BIO SURGEON STRL SZ7 (GLOVE) ×2 IMPLANT
GLOVE BIO SURGEON STRL SZ8 (GLOVE) ×4 IMPLANT
GLOVE BIOGEL PI IND STRL 6.5 (GLOVE) ×3 IMPLANT
GLOVE BIOGEL PI IND STRL 7.0 (GLOVE) ×2 IMPLANT
GLOVE BIOGEL PI INDICATOR 6.5 (GLOVE) ×3
GLOVE BIOGEL PI INDICATOR 7.0 (GLOVE) ×2
GLOVE SURG SS PI 6.5 STRL IVOR (GLOVE) ×10 IMPLANT
GOWN STRL REUS W/ TWL LRG LVL3 (GOWN DISPOSABLE) ×3 IMPLANT
GOWN STRL REUS W/ TWL XL LVL3 (GOWN DISPOSABLE) ×2 IMPLANT
GOWN STRL REUS W/TWL 2XL LVL3 (GOWN DISPOSABLE) IMPLANT
GOWN STRL REUS W/TWL LRG LVL3 (GOWN DISPOSABLE) ×3
GOWN STRL REUS W/TWL XL LVL3 (GOWN DISPOSABLE) ×2
HEMOSTAT POWDER KIT SURGIFOAM (HEMOSTASIS) ×2 IMPLANT
KIT BASIN OR (CUSTOM PROCEDURE TRAY) ×2 IMPLANT
KIT BONE MRW ASP ANGEL CPRP (KITS) ×2 IMPLANT
KIT TURNOVER KIT B (KITS) ×2 IMPLANT
MATRIX STRIP NEOCORE 12C (Putty) ×1 IMPLANT
NEEDLE HYPO 25X1 1.5 SAFETY (NEEDLE) ×2 IMPLANT
NS IRRIG 1000ML POUR BTL (IV SOLUTION) ×2 IMPLANT
OIL CARTRIDGE MAESTRO DRILL (MISCELLANEOUS) ×2
PACK LAMINECTOMY NEURO (CUSTOM PROCEDURE TRAY) ×2 IMPLANT
PAD ARMBOARD 7.5X6 YLW CONV (MISCELLANEOUS) ×6 IMPLANT
PUTTY DBM ALLOSYNC PURE 10CC (Putty) ×2 IMPLANT
ROD PC 5.5X35 TI ARSENAL (Rod) ×4 IMPLANT
SCREW CBX 5.5X35MM (Screw) ×4 IMPLANT
SCREW CBX 6.5X35MM (Screw) ×4 IMPLANT
SCREW SET SPINAL ARSENAL 47127 (Screw) ×8 IMPLANT
SPONGE LAP 4X18 RFD (DISPOSABLE) IMPLANT
SPONGE SURGIFOAM ABS GEL 100 (HEMOSTASIS) ×2 IMPLANT
STRIP CLOSURE SKIN 1/2X4 (GAUZE/BANDAGES/DRESSINGS) ×4 IMPLANT
STRIP MATRIX NEOCORE 12CC (Putty) ×1 IMPLANT
SUT VIC AB 0 CT1 18XCR BRD8 (SUTURE) ×1 IMPLANT
SUT VIC AB 0 CT1 8-18 (SUTURE) ×1
SUT VIC AB 2-0 CP2 18 (SUTURE) ×2 IMPLANT
SUT VIC AB 3-0 SH 8-18 (SUTURE) ×4 IMPLANT
TOWEL GREEN STERILE (TOWEL DISPOSABLE) ×2 IMPLANT
TOWEL GREEN STERILE FF (TOWEL DISPOSABLE) ×2 IMPLANT
TRAY FOLEY MTR SLVR 16FR STAT (SET/KITS/TRAYS/PACK) ×2 IMPLANT
WATER STERILE IRR 1000ML POUR (IV SOLUTION) ×2 IMPLANT

## 2018-05-12 NOTE — Op Note (Signed)
05/12/2018  12:39 PM  PATIENT:  Angela Sims  47 y.o. female  PRE-OPERATIVE DIAGNOSIS: Pseudo Arthrosis L5-S1, failed back syndrome, back and right leg pain  POST-OPERATIVE DIAGNOSIS:  same  PROCEDURE:   1. Decompressive lumbar laminectomy L5-S1 right decompressed the L5 and S1 nerve roots 2. Posterior fixation L5 S1 using Alphatec cortical pedicle screws.  3. Intertransverse arthrodesis L5-S1 using morcellized autograft and allograft soaked with bone marrow aspirate obtained through a separate fascial incision over the right iliac crest.  SURGEON:  Marikay Alar, MD  ASSISTANTS: Verlin Dike FNP  ANESTHESIA:  General  EBL: 30 ml  Total I/O In: 1000 [I.V.:1000] Out: 230 [Urine:200; Blood:30]  BLOOD ADMINISTERED:none  DRAINS: none   INDICATION FOR PROCEDURE: This patient presented withsevere back and right leg pain. Imaging revealed pseudoarthrosis L5-S1. The patient tried a reasonable attempt at conservative medical measures without relief. I recommended decompression and instrumented fusion to address the stenosis as well as the segmental  instability.  Patient understood the risks, benefits, and alternatives and potential outcomes and wished to proceed.  PROCEDURE DETAILS:  The patient was brought to the operating room. After induction of generalized endotracheal anesthesia the patient was rolled into the prone position on chest rolls and all pressure points were padded. The patient's lumbar region was cleaned and then prepped with DuraPrep and draped in the usual sterile fashion. Anesthesia was injected and then a dorsal midline incision was made and carried down to the lumbosacral fascia. The fascia was opened and the paraspinous musculature was taken down in a subperiosteal fashion to expose L5-S1. A self-retaining retractor was placed. Intraoperative fluoroscopy confirmed my level, and I started with placement of the L5 cortical pedicle screws. The pedicle screw entry zones  were identified utilizing surface landmarks and  AP and lateral fluoroscopy. I scored the cortex with the high-speed drill and then used the hand drill to drill an upward and outward direction into the pedicle. I then tapped line to line. I then placed a 5.5 x 35 mm cortical pedicle screw into the pedicles of L5 bilaterally. I dissected in a suprafascial plane to expose the right iliac crest. I opened the fascia and the Jamshidi needle to remove 60 mL of bone marrow aspirate which was spun down into [facility] and was put on morcellized allograft for later arthrodesis. -Was closed and I replaced the retractors.I then turned my attention to the decompression and complete lumbar laminectomies, hemi- facetectomies, and foraminotomies were performed at L5-S1. A limited decompression was done on the left to expose the S1 pedicle but a full facetectomy was performed on the right to decompress the right L5 and S1 nerve roots. As was a redo decompression after previous microdiscectomy at another hospital.  Much more generous decompression and generous foraminotomy was undertaken in order to adequately decompress the neural elements and address the patient's leg pain. The yellow ligament was removed to expose the underlying dura and nerve roots, and generous foraminotomies were performed to adequately decompress the neural elements. Both the exiting and traversing nerve roots were decompressed  until a coronary dilator passed easily along the nerve roots. Once the decompression was complete  We then turned our attention to the placement of the lower pedicle screws. The pedicle screw entry zones were identified utilizing surface landmarks and fluoroscopy. I drilled into each pedicle utilizing the hand drill, and tapped each pedicle with the appropriate tap. We palpated with a ball probe to assure no break in the cortex. We then placed  its 6.5 x 35 mm into the pedicles bilaterally at S1. We then decorticated the transverse processes  and laid a mixture of morcellized autograft and allograft out over these to perform intertransverse arthrodesis at 5 S1 bilaterally. We then placed lordotic rods into the multiaxial screw heads of the pedicle screws and locked these in position with the locking caps and anti-torque device. We then checked our construct with AP and lateral fluoroscopy. Irrigated with copious amounts of bacitracin-containing saline solution. Inspected the nerve roots once again to assure adequate decompression,  placed powdered vancomycin into the wound, and closed the muscle and the fascia with 0 Vicryl. Closed the subcutaneous tissues with 2-0 Vicryl and subcuticular tissues with 3-0 Vicryl. The skin was closed with benzoin and Steri-Strips. Dressing was then applied, the patient was awakened from general anesthesia and transported to the recovery room in stable condition. At the end of the procedure all sponge, needle and instrument counts were correct.   PLAN OF CARE: admit to inpatient  PATIENT DISPOSITION:  PACU - hemodynamically stable.   Delay start of Pharmacological VTE agent (>24hrs) due to surgical blood loss or risk of bleeding:  yes

## 2018-05-12 NOTE — Anesthesia Procedure Notes (Signed)
Procedure Name: Intubation Date/Time: 05/12/2018 10:29 AM Performed by: Candis Shine, CRNA Pre-anesthesia Checklist: Patient identified, Emergency Drugs available, Suction available and Patient being monitored Patient Re-evaluated:Patient Re-evaluated prior to induction Oxygen Delivery Method: Circle System Utilized Preoxygenation: Pre-oxygenation with 100% oxygen Induction Type: IV induction Ventilation: Mask ventilation without difficulty Laryngoscope Size: Mac and 3 Grade View: Grade I Tube type: Oral Tube size: 7.0 mm Number of attempts: 1 Airway Equipment and Method: Stylet and Oral airway Placement Confirmation: ETT inserted through vocal cords under direct vision,  positive ETCO2 and breath sounds checked- equal and bilateral Secured at: 18 cm Tube secured with: Tape Dental Injury: Teeth and Oropharynx as per pre-operative assessment

## 2018-05-12 NOTE — Evaluation (Signed)
Physical Therapy Evaluation Patient Details Name: Angela Sims MRN: [id number] DOB: 1971/08/21 Today's Date: 05/12/2018   History of Present Illness  Pt is a 47 y.o. F with significant PMH of several back surgeries including previous anterior lumbar fusion and cervical disc arthroplasty. Admitted with MRI showing pseudoarthrosis L5-S1 now s/p decompressive lumbar laminectomy L5-S1, posterior fixation L5-S1, intertransverse arthrodesis L5-S1.  Clinical Impression  Patient evaluated by Physical Therapy with no further acute PT needs identified. Patient ambulating hallway distances with no assistive device and supervision; mild balance deficits noted but suspect patient will progress quickly. Displays good strength and pain control. Patient husband present and able to provide necessary level of assist. All education has been completed and the patient has no further questions. No follow-up Physical Therapy or equipment needs. PT is signing off. Thank you for this referral.     Follow Up Recommendations No PT follow up    Equipment Recommendations  None recommended by PT    Recommendations for Other Services       Precautions / Restrictions Precautions Precautions: Back Precaution Booklet Issued: Yes (comment) Precaution Comments: patient able to recall 3/3; written handout provided.  Required Braces or Orthoses: Spinal Brace Spinal Brace: Other (comment) Spinal Brace Comments: At home; per Dr. Yetta Barre, okay to mobilize without in hospital Restrictions Weight Bearing Restrictions: No      Mobility  Bed Mobility Overal bed mobility: Modified Independent             General bed mobility comments: good log roll technique  Transfers Overall transfer level: Needs assistance   Transfers: Sit to/from Stand Sit to Stand: Supervision         General transfer comment: increased time to achieve bilateral knee extension  Ambulation/Gait Ambulation/Gait assistance:  Supervision Gait Distance (Feet): 400 Feet Assistive device: None Gait Pattern/deviations: Step-through pattern;Decreased stride length Gait velocity: decr Gait velocity interpretation: <1.8 ft/sec, indicate of risk for recurrent falls General Gait Details: intermittently reaching for railing for additional support but no overt LOB. slow and guarded gait pattern with bilateral shoulder elevation  Stairs            Wheelchair Mobility    Modified Rankin (Stroke Patients Only)       Balance Overall balance assessment: Mild deficits observed, not formally tested                                           Pertinent Vitals/Pain Pain Assessment: Faces Faces Pain Scale: Hurts little more Pain Location: incisional site Pain Descriptors / Indicators: Sore;Operative site guarding Pain Intervention(s): Monitored during session    Home Living Family/patient expects to be discharged to:: Private residence Living Arrangements: Spouse/significant other;Children Available Help at Discharge: Family Type of Home: House Home Access: Stairs to enter   Secretary/administrator of Steps: 3 Home Layout: One level Home Equipment: Shower seat - built in      Prior Function Level of Independence: Independent         Comments: works from home part time     Higher education careers adviser        Extremity/Trunk Assessment   Upper Extremity Assessment Upper Extremity Assessment: RUE deficits/detail;LUE deficits/detail RUE Deficits / Details: 5/5 LUE Deficits / Details: 5/5    Lower Extremity Assessment Lower Extremity Assessment: RLE deficits/detail;LLE deficits/detail RLE Deficits / Details: 5/5 LLE Deficits / Details: 5/5    Cervical /  Trunk Assessment Cervical / Trunk Assessment: Other exceptions Cervical / Trunk Exceptions: s/p spinal sx, forward head posture  Communication   Communication: No difficulties  Cognition Arousal/Alertness: Awake/alert Behavior During  Therapy: WFL for tasks assessed/performed Overall Cognitive Status: Within Functional Limits for tasks assessed                                        General Comments General comments (skin integrity, edema, etc.): pt husband present    Exercises Other Exercises Other Exercises: Seated cervical retractions x 5 (5 s hold)   Assessment/Plan    PT Assessment Patent does not need any further PT services  PT Problem List         PT Treatment Interventions      PT Goals (Current goals can be found in the Care Plan section)  Acute Rehab PT Goals Patient Stated Goal: "do Pilates once able." PT Goal Formulation: All assessment and education complete, DC therapy    Frequency     Barriers to discharge        Co-evaluation               AM-PAC PT "6 Clicks" Daily Activity  Outcome Measure Difficulty turning over in bed (including adjusting bedclothes, sheets and blankets)?: None Difficulty moving from lying on back to sitting on the side of the bed? : None Difficulty sitting down on and standing up from a chair with arms (e.g., wheelchair, bedside commode, etc,.)?: A Little Help needed moving to and from a bed to chair (including a wheelchair)?: A Little Help needed walking in hospital room?: A Little Help needed climbing 3-5 steps with a railing? : A Little 6 Click Score: 20    End of Session   Activity Tolerance: Patient tolerated treatment well Patient left: in bed;with call bell/phone within reach;with family/visitor present Nurse Communication: Mobility status PT Visit Diagnosis: Unsteadiness on feet (R26.81);Difficulty in walking, not elsewhere classified (R26.2);Pain Pain - part of body: (back)    Time: 1500-1520 PT Time Calculation (min) (ACUTE ONLY): 20 min   Charges:   PT Evaluation $PT Eval Low Complexity: 1 Low         Laurina Bustle, PT, DPT Acute Rehabilitation Services Pager [phone] Office [phone]   Vanetta Mulders 05/12/2018, 3:44 PM

## 2018-05-12 NOTE — H&P (Signed)
Subjective: Patient is a 47 y.o. female admitted for pseudoarthrosis L5-S1. Onset of symptoms was several months ago, gradually worsening since that time.  The pain is rated severe, and is located at the across the lower back. The pain is described as aching and occurs all day. The symptoms have been progressive. Symptoms are exacerbated by exercise. MRI or CT showed pseudoarthrosis L5-S1   Past Medical History:  Diagnosis Date  . Complication of anesthesia   . Deviated septum    right side  . Fibromyalgia   . Herniated cervical disc 12/02/15  . High cholesterol   . Myofascial pain syndrome   . PONV (postoperative nausea and vomiting) 12/05/15  . Pseudoarthrosis of lumbar spine   . Restless leg syndrome   . Seasonal allergies     Past Surgical History:  Procedure Laterality Date  . ABDOMINAL EXPOSURE N/A 12/05/2015   Procedure: ABDOMINAL EXPOSURE;  Surgeon: Larina Earthly, MD;  Location: [facility];  Service: Vascular;  Laterality: N/A;  . ANTERIOR LUMBAR FUSION N/A 12/05/2015   Procedure: ANTERIOR LUMBAR FUSION LUMBAR FIVE-SACRAL ONE ;  Surgeon: Tia Alert, MD;  Location: [facility];  Service: Neurosurgery;  Laterality: N/A;  Abdominal approach  . CERVICAL DISC ARTHROPLASTY N/A 01/22/2016   Procedure: C5-6 Cervical artificial disc replacment;  Surgeon: Tia Alert, MD;  Location: [facility];  Service: Neurosurgery;  Laterality: N/A;  C5-6 Cervical artificial disc replacment  . LUMBAR FUSION  12/05/2015  . LUMBAR LAMINECTOMY/DECOMPRESSION MICRODISCECTOMY Right 06/25/2015   Procedure: LUMBAR LAMINECTOMY/DECOMPRESSION MICRODISCECTOMY 1 LEVEL; RIGHT ;  Surgeon: Tia Alert, MD;  Location: [facility];  Service: Neurosurgery;  Laterality: Right;  . NO PAST SURGERIES    . WISDOM TOOTH EXTRACTION      Prior to Admission medications   Medication Sig Start Date End Date Taking? Authorizing Provider  albuterol (PROVENTIL HFA;VENTOLIN HFA) 108 (90 BASE) MCG/ACT inhaler Inhale 1-2  puffs into the lungs daily as needed for wheezing or shortness of breath.    Yes [provider]  amitriptyline (ELAVIL) 25 MG tablet Take 25 mg by mouth at bedtime.   Yes [provider]  fluticasone (FLONASE) 50 MCG/ACT nasal spray Place 1 spray into both nostrils daily as needed for allergies.    Yes [provider]  hydroxychloroquine (PLAQUENIL) 200 MG tablet Take 200 mg by mouth 2 (two) times daily.  04/26/17  Yes [provider]  meloxicam (MOBIC) 15 MG tablet Take 15 mg by mouth daily.   Yes [provider]  Norethindrone Acetate-Ethinyl Estrad-FE (JUNEL FE 24) 1-20 MG-MCG(24) tablet Take 1 tablet by mouth daily.  07/13/17  Yes [provider]   Allergies  Allergen Reactions  . Lactose Intolerance (Gi) Other (See Comments)    Gassy / Cramps  . Gabapentin Other (See Comments)    moody  . Penicillins Rash    Has patient had a PCN reaction causing immediate rash, facial/tongue/throat swelling, SOB or lightheadedness with hypotension: Yes Has patient had a PCN reaction causing severe rash involving mucus membranes or skin necrosis: No Has patient had a PCN reaction that required hospitalization: No Has patient had a PCN reaction occurring within the last 10 years: Yes If all of the above answers are "NO", then may proceed with Cephalosporin use.    Social History   Tobacco Use  . Smoking status: Never Smoker  . Smokeless tobacco: Never Used  Substance Use Topics  . Alcohol use: Yes    Comment:  1 or 2 drinks occasionally    Family History  Problem Relation Age of Onset  . Bladder Cancer Mother   . Alcohol abuse Father      Review of Systems  Positive ROS: neg  All other systems have been reviewed and were otherwise negative with the exception of those mentioned in the HPI and as above.  Objective: Vital signs in last 24 hours: Temp:  [97.8 F (36.6 C)] 97.8 F (36.6 C) (09/06 0840) Pulse Rate:  [82] 82 (09/06  0840) Resp:  [20] 20 (09/06 0840) BP: (137)/(70) 137/70 (09/06 0840) SpO2:  [100 %] 100 % (09/06 0840)  General Appearance: Alert, cooperative, no distress, appears stated age Head: Normocephalic, without obvious abnormality, atraumatic Eyes: PERRL, conjunctiva/corneas clear, EOM's intact    Neck: Supple, symmetrical, trachea midline Back: Symmetric, no curvature, ROM normal, no CVA tenderness Lungs:  respirations unlabored Heart: Regular rate and rhythm Abdomen: Soft, non-tender Extremities: Extremities normal, atraumatic, no cyanosis or edema Pulses: 2+ and symmetric all extremities Skin: Skin color, texture, turgor normal, no rashes or lesions  NEUROLOGIC:   Mental status: Alert and oriented x4,  no aphasia, good attention span, fund of knowledge, and memory Motor Exam - grossly normal Sensory Exam - grossly normal Reflexes: 1+ Coordination - grossly normal Gait - grossly normal Balance - grossly normal Cranial Nerves: I: smell Not tested  II: visual acuity  OS: nl    OD: nl  II: visual fields Full to confrontation  II: pupils Equal, round, reactive to light  III,VII: ptosis None  III,IV,VI: extraocular muscles  Full ROM  V: mastication Normal  V: facial light touch sensation  Normal  V,VII: corneal reflex  Present  VII: facial muscle function - upper  Normal  VII: facial muscle function - lower Normal  VIII: hearing Not tested  IX: soft palate elevation  Normal  IX,X: gag reflex Present  XI: trapezius strength  5/5  XI: sternocleidomastoid strength 5/5  XI: neck flexion strength  5/5  XII: tongue strength  Normal    Data Review Lab Results  Component Value Date   WBC 14.7 (H) 05/03/2018   HGB 14.0 05/03/2018   HCT 44.5 05/03/2018   MCV 92.5 05/03/2018   PLT 407 (H) 05/03/2018   Lab Results  Component Value Date   NA 139 05/03/2018   K 4.5 05/03/2018   CL 105 05/03/2018   CO2 23 05/03/2018   BUN 18 05/03/2018   CREATININE 0.81 05/03/2018   GLUCOSE 84  05/03/2018   Lab Results  Component Value Date   INR 1.01 05/03/2018    Assessment/Plan:  Estimated body mass index is 23.76 kg/m as calculated from the following:   Height as of 05/03/18: 5\' 2"  (1.575 m).   Weight as of 05/03/18: 58.9 kg. Patient admitted for posterior lumbar fusion with instrumentation L5-S1. Patient has failed a reasonable attempt at conservative therapy.  I explained the condition and procedure to the patient and answered any questions.  Patient wishes to proceed with procedure as planned. Understands risks/ benefits and typical outcomes of procedure.   Owen Pagnotta S 05/12/2018 9:46 AM

## 2018-05-12 NOTE — Progress Notes (Signed)
Pharmacy Antibiotic Note  Angela Sims is a 47 y.o. female admitted on 05/12/2018 for planned spinal surgery.  Pharmacy has been consulted for Vancomycin dosing post-op for surgical prophylaxis.   The pre-op dose of Vancomycin 1g was given at 0930. No drain is in place per RN report.   Plan: - Vancomycin 750 mg x 1 at 2130 today - Pharmacy will sign off as no further doses are expected at this time     Temp (24hrs), Avg:97.5 F (36.4 C), Min:97.3 F (36.3 C), Max:97.8 F (36.6 C)  No results for input(s): WBC, CREATININE, LATICACIDVEN, VANCOTROUGH, VANCOPEAK, VANCORANDOM, GENTTROUGH, GENTPEAK, GENTRANDOM, TOBRATROUGH, TOBRAPEAK, TOBRARND, AMIKACINPEAK, AMIKACINTROU, AMIKACIN in the last 168 hours.  Estimated Creatinine Clearance: 67.2 mL/min (by C-G formula based on SCr of 0.81 mg/dL).    Allergies  Allergen Reactions  . Lactose Intolerance (Gi) Other (See Comments)    Gassy / Cramps  . Gabapentin Other (See Comments)    moody  . Penicillins Rash    Has patient had a PCN reaction causing immediate rash, facial/tongue/throat swelling, SOB or lightheadedness with hypotension: Yes Has patient had a PCN reaction causing severe rash involving mucus membranes or skin necrosis: No Has patient had a PCN reaction that required hospitalization: No Has patient had a PCN reaction occurring within the last 10 years: Yes If all of the above answers are "NO", then may proceed with Cephalosporin use.    Georgina Pillion, PharmD, BCPS Pager: [phone] 2:18 PM

## 2018-05-12 NOTE — Transfer of Care (Signed)
Immediate Anesthesia Transfer of Care Note  Patient: Angela Sims  Procedure(s) Performed: Posterior lateral fusion - Lumbar five-Sacral one with non-segmental fixation (N/A Back)  Patient Location: PACU  Anesthesia Type:General  Level of Consciousness: awake, alert  and oriented  Airway & Oxygen Therapy: Patient Spontanous Breathing and Patient connected to nasal cannula oxygen  Post-op Assessment: Report given to RN and Post -op Vital signs reviewed and stable  Post vital signs: Reviewed and stable  Last Vitals:  Vitals Value Taken Time  BP 122/84 05/12/2018 12:30 PM  Temp    Pulse 81 05/12/2018 12:33 PM  Resp 17 05/12/2018 12:33 PM  SpO2 100 % 05/12/2018 12:33 PM  Vitals shown include unvalidated device data.  Last Pain:  Vitals:   05/12/18 0937  TempSrc:   PainSc: 5          Complications: No apparent anesthesia complications

## 2018-05-12 NOTE — Anesthesia Preprocedure Evaluation (Signed)
Anesthesia Evaluation  Patient identified by MRN, date of birth, ID band Patient awake    Reviewed: Allergy & Precautions, NPO status , Patient's Chart, lab work & pertinent test results  History of Anesthesia Complications (+) PONV  Airway Mallampati: II  TM Distance: >3 FB Neck ROM: Full    Dental  (+) Teeth Intact, Dental Advisory Given   Pulmonary neg pulmonary ROS,    breath sounds clear to auscultation       Cardiovascular negative cardio ROS   Rhythm:Regular     Neuro/Psych negative neurological ROS  negative psych ROS   GI/Hepatic negative GI ROS, Neg liver ROS,   Endo/Other  negative endocrine ROS  Renal/GU negative Renal ROS  negative genitourinary   Musculoskeletal  (+) Fibromyalgia -  Abdominal   Peds negative pediatric ROS (+)  Hematology negative hematology ROS (+) 13/41   Anesthesia Other Findings   Reproductive/Obstetrics negative OB ROS                             Anesthesia Physical  Anesthesia Plan  ASA: II  Anesthesia Plan: General   Post-op Pain Management:    Induction: Intravenous  PONV Risk Score and Plan: 4 or greater and Ondansetron, Dexamethasone, Midazolam, Scopolamine patch - Pre-op and Treatment may vary due to age or medical condition  Airway Management Planned: Oral ETT  Additional Equipment:   Intra-op Plan:   Post-operative Plan: Extubation in OR  Informed Consent: I have reviewed the patients History and Physical, chart, labs and discussed the procedure including the risks, benefits and alternatives for the proposed anesthesia with the patient or authorized representative who has indicated his/her understanding and acceptance.   Dental advisory given  Plan Discussed with: CRNA and Anesthesiologist  Anesthesia Plan Comments:         Anesthesia Quick Evaluation

## 2018-05-12 NOTE — Discharge Summary (Signed)
Physician Discharge Summary  Patient ID: Angela Sims MRN: [id number] DOB/AGE: [birth year] 49 y.o.  Admit date: 05/12/2018 Discharge date: 05/12/2018  Admission Diagnoses: pseudoarthrosis L5-s1    Discharge Diagnoses: same   Discharged Condition: good  Hospital Course: The patient was admitted on 05/12/2018 and taken to the operating room where the patient underwent re-do fusion L5-S1. The patient tolerated the procedure well and was taken to the recovery room and then to the floor in stable condition. The hospital course was routine. There were no complications. The wound remained clean dry and intact. Pt had appropriate back soreness. No complaints of leg pain or new N/T/W. The patient remained afebrile with stable vital signs, and tolerated a regular diet. The patient continued to increase activities, and pain was well controlled with oral pain medications.   Consults: None  Significant Diagnostic Studies:  Results for orders placed or performed during the hospital encounter of 05/12/18  Pregnancy, urine POC  Result Value Ref Range   Preg Test, Ur NEGATIVE NEGATIVE    Chest 2 View  Result Date: 05/03/2018 CLINICAL DATA:  Pre-admission for back surgery. EXAM: CHEST - 2 VIEW COMPARISON:  None. FINDINGS: The heart size and mediastinal contours are within normal limits. Both lungs are clear. The visualized skeletal structures are unremarkable. IMPRESSION: No active cardiopulmonary disease. Electronically Signed   By: Lupita Raider, M.D.   On: 05/03/2018 15:24   Dg Lumbar Spine 2-3 Views  Result Date: 05/12/2018 CLINICAL DATA:  Portable operative imaging for lumbar fusion. 46 seconds of fluoro time. EXAM: LUMBAR SPINE - 2-3 VIEW; DG C-ARM 61-120 MIN COMPARISON:  None. FINDINGS: Two submitted images show bilateral pedicle screws with interconnecting rods at L5 and S1. There is anterior fusion hardware also at L5-S1. There well-centered radiolucent disc spacer maintains the L5-S1 disc  space height. The orthopedic hardware appears well positioned and well-seated. IMPRESSION: Well-positioned orthopedic hardware for L5-S1 fusion. Electronically Signed   By: Amie Portland M.D.   On: 05/12/2018 12:18   Dg Fluoro Guide Spinal/si Jt Inj Right  Result Date: 04/26/2018 CLINICAL DATA:  Sacroiliac joint dysfunction. Right greater than left low back/buttock and leg pain. FLUOROSCOPY TIME:  Radiation Exposure Index (as provided by the fluoroscopic device): 8.95 microGray*m^2 Fluoroscopy Time (in minutes and seconds):  7 seconds PROCEDURE: RIGHT SI JOINT INJECTION. After a thorough discussion of risks and benefits of the procedure, including bleeding, infection, injury to nerves, blood vessels, and adjacent structures, verbal and written consent was obtained. Specific risks of the procedure included nondiagnostic/nontherapeutic injection and non target injection. The patient was placed prone on the fluoroscopy table and localization was performed over the sacrum. Target site marked using fluoroscopic guidance. The skin was prepped and draped in the usual sterile fashion using Betadine soap. After local anesthesia with 1% lidocaine without epinephrine and subsequent deep anesthesia, a 3.5 inch 22 gauge spinal needle was advanced into the right SI joint. Injection of 0.5 mL of Isovue-M 200 confirmed intra-articular placement. No vascular uptake present. Subsequently, 120 mg of Depo-Medrol and 1 mL of 1% lidocaine were injected into the SI joint. The needle was removed and a sterile dressing applied. No complications were observed. The patient was observed and released under the care of a driver after 30 minutes. With regards to right-sided pain: Preprocedure pain score:  5-6/10 Postprocedure pain score: 0/10 IMPRESSION: Successful fluoroscopically guided right SI joint injection. Electronically Signed   By: Sebastian Ache M.D.   On: 04/26/2018 10:28   Dg C-arm  1-60 Min  Result Date: 05/12/2018 CLINICAL  DATA:  Portable operative imaging for lumbar fusion. 46 seconds of fluoro time. EXAM: LUMBAR SPINE - 2-3 VIEW; DG C-ARM 61-120 MIN COMPARISON:  None. FINDINGS: Two submitted images show bilateral pedicle screws with interconnecting rods at L5 and S1. There is anterior fusion hardware also at L5-S1. There well-centered radiolucent disc spacer maintains the L5-S1 disc space height. The orthopedic hardware appears well positioned and well-seated. IMPRESSION: Well-positioned orthopedic hardware for L5-S1 fusion. Electronically Signed   By: Amie Portland M.D.   On: 05/12/2018 12:18    Antibiotics:  Anti-infectives (From admission, onward)   Start     Dose/Rate Route Frequency Ordered Stop   05/12/18 2200  hydroxychloroquine (PLAQUENIL) tablet 200 mg     200 mg Oral 2 times daily 05/12/18 1339     05/12/18 2130  vancomycin (VANCOCIN) IVPB 750 mg/150 ml premix     750 mg 150 mL/hr over 60 Minutes Intravenous  Once 05/12/18 1418     05/12/18 1156  vancomycin (VANCOCIN) powder  Status:  Discontinued       As needed 05/12/18 1156 05/12/18 1226   05/12/18 1103  bacitracin 50,000 Units in sodium chloride 0.9 % 500 mL irrigation  Status:  Discontinued       As needed 05/12/18 1104 05/12/18 1226   05/12/18 0930  vancomycin (VANCOCIN) IVPB 1000 mg/200 mL premix     1,000 mg 200 mL/hr over 60 Minutes Intravenous On call to O.R. 05/12/18 0919 05/12/18 1025      Discharge Exam: Blood pressure 121/65, pulse 73, temperature (!) 97.4 F (36.3 C), temperature source Oral, resp. rate 17, last menstrual period 04/19/2018, SpO2 100 %. Neurologic: Grossly normal Dressing dry  Discharge Medications:   Allergies as of 05/12/2018      Reactions   Lactose Intolerance (gi) Other (See Comments)   Gassy / Cramps   Gabapentin Other (See Comments)   moody   Penicillins Rash   Has patient had a PCN reaction causing immediate rash, facial/tongue/throat swelling, SOB or lightheadedness with hypotension: Yes Has patient  had a PCN reaction causing severe rash involving mucus membranes or skin necrosis: No Has patient had a PCN reaction that required hospitalization: No Has patient had a PCN reaction occurring within the last 10 years: Yes If all of the above answers are "NO", then may proceed with Cephalosporin use.      Medication List    TAKE these medications   albuterol 108 (90 Base) MCG/ACT inhaler Commonly known as:  PROVENTIL HFA;VENTOLIN HFA Inhale 1-2 puffs into the lungs daily as needed for wheezing or shortness of breath.   amitriptyline 25 MG tablet Commonly known as:  ELAVIL Take 25 mg by mouth at bedtime.   fluticasone 50 MCG/ACT nasal spray Commonly known as:  FLONASE Place 1 spray into both nostrils daily as needed for allergies.   HYDROcodone-acetaminophen 7.5-325 MG tablet Commonly known as:  NORCO Take 1 tablet by mouth every 4 (four) hours as needed for moderate pain ((score 4 to 6)).   hydroxychloroquine 200 MG tablet Commonly known as:  PLAQUENIL Take 200 mg by mouth 2 (two) times daily.   JUNEL FE 24 1-20 MG-MCG(24) tablet Generic drug:  Norethindrone Acetate-Ethinyl Estrad-FE Take 1 tablet by mouth daily.   meloxicam 15 MG tablet Commonly known as:  MOBIC Take 15 mg by mouth daily.   methocarbamol 500 MG tablet Commonly known as:  ROBAXIN Take 1 tablet (500 mg total) by mouth every  8 (eight) hours as needed for muscle spasms.   ondansetron 4 MG tablet Commonly known as:  ZOFRAN Take 1 tablet (4 mg total) by mouth every 8 (eight) hours as needed for nausea or vomiting.            Durable Medical Equipment  (From admission, onward)         Start     Ordered   05/12/18 1408  DME Walker rolling  Once    Question:  Patient needs a walker to treat with the following condition  Answer:  S/P lumbar fusion   05/12/18 1407   05/12/18 1408  DME 3 n 1  Once     05/12/18 1407          Disposition: home   Final Dx: re-do fusion L5-S1  Discharge  Instructions     Remove dressing in 72 hours   Complete by:  As directed    Call MD for:  difficulty breathing, headache or visual disturbances   Complete by:  As directed    Call MD for:  persistant nausea and vomiting   Complete by:  As directed    Call MD for:  redness, tenderness, or signs of infection (pain, swelling, redness, odor or green/yellow discharge around incision site)   Complete by:  As directed    Call MD for:  severe uncontrolled pain   Complete by:  As directed    Call MD for:  temperature >100.4   Complete by:  As directed    Diet - low sodium heart healthy   Complete by:  As directed    Increase activity slowly   Complete by:  As directed          Signed: August Longest S 05/12/2018, 3:38 PM

## 2018-05-12 NOTE — Evaluation (Signed)
Occupational Therapy Evaluation Patient Details Name: Angela Sims MRN: [id number] DOB: 17-Oct-1970 Today's Date: 05/12/2018    History of Present Illness Pt is a 47 y.o. F with significant PMH of several back surgeries including previous anterior lumbar fusion and cervical disc arthroplasty. Admitted with MRI showing pseudoarthrosis L5-S1 now s/p decompressive lumbar laminectomy L5-S1, posterior fixation L5-S1, intertransverse arthrodesis L5-S1.   Clinical Impression   Patient evaluated by Occupational Therapy with no further acute OT needs identified. All education has been completed and the patient has no further questions. See below for any follow-up Occupational Therapy or equipment needs. OT to sign off. Thank you for referral.      Follow Up Recommendations  No OT follow up    Equipment Recommendations  None recommended by OT    Recommendations for Other Services       Precautions / Restrictions Precautions Precautions: Back Precaution Booklet Issued: Yes (comment) Precaution Comments: patient able to recall 3/3; written handout provided.  Required Braces or Orthoses: Spinal Brace Spinal Brace: Other (comment) Spinal Brace Comments: At home; per Dr. Yetta Barre, okay to mobilize without in hospital Restrictions Weight Bearing Restrictions: No      Mobility Bed Mobility Overal bed mobility: Modified Independent             General bed mobility comments: good log roll technique  Transfers Overall transfer level: Needs assistance   Transfers: Sit to/from Stand Sit to Stand: Supervision         General transfer comment: increased time to achieve bilateral knee extension    Balance Overall balance assessment: Mild deficits observed, not formally tested                                         ADL either performed or assessed with clinical judgement   ADL Overall ADL's : Independent                                        General ADL Comments: able to cross figure 4 and cross for dresing and bathing.    Back handout provided and reviewed adls in detail. Pt educated on: clothing between brace, never sleep in brace, set an alarm at night for medication, avoid sitting for long periods of time, correct bed positioning for sleeping, correct sequence for bed mobility, avoiding lifting more than 5 pounds and never wash directly over incision. All education is complete and patient indicates understanding.  Pt has a cat at home and works from home. Educated on change of position frequently   Vision         Perception     Praxis      Pertinent Vitals/Pain Pain Assessment: Faces Faces Pain Scale: Hurts little more Pain Location: incisional site Pain Descriptors / Indicators: Sore;Operative site guarding Pain Intervention(s): Monitored during session;Premedicated before session;Repositioned     Hand Dominance Right   Extremity/Trunk Assessment Upper Extremity Assessment Upper Extremity Assessment: Overall WFL for tasks assessed RUE Deficits / Details: 5/5 LUE Deficits / Details: 5/5   Lower Extremity Assessment Lower Extremity Assessment: Overall WFL for tasks assessed RLE Deficits / Details: 5/5 LLE Deficits / Details: 5/5   Cervical / Trunk Assessment Cervical / Trunk Assessment: Other exceptions Cervical / Trunk Exceptions: s/p spinal sx, forward head posture   Communication  Communication Communication: No difficulties   Cognition Arousal/Alertness: Awake/alert Behavior During Therapy: WFL for tasks assessed/performed Overall Cognitive Status: Within Functional Limits for tasks assessed                                     General Comments  husband present for all education    Exercises Exercises: Other exercises Other Exercises Other Exercises: Seated cervical retractions x 5 (5 s hold)   Shoulder Instructions      Home Living Family/patient expects to be discharged  to:: Private residence Living Arrangements: Spouse/significant other;Children Available Help at Discharge: Family Type of Home: House Home Access: Stairs to enter Secretary/administrator of Steps: 3   Home Layout: One level     Bathroom Shower/Tub: Producer, television/film/video: Standard     Home Equipment: Information systems manager - built in          Prior Functioning/Environment Level of Independence: Independent        Comments: works from home part time        OT Problem List: Decreased strength      OT Treatment/Interventions:      OT Goals(Current goals can be found in the care plan section) Acute Rehab OT Goals Patient Stated Goal: to go home OT Goal Formulation: With patient  OT Frequency:     Barriers to D/C:            Co-evaluation              AM-PAC PT "6 Clicks" Daily Activity     Outcome Measure Help from another person eating meals?: None Help from another person taking care of personal grooming?: None Help from another person toileting, which includes using toliet, bedpan, or urinal?: None Help from another person bathing (including washing, rinsing, drying)?: None Help from another person to put on and taking off regular upper body clothing?: None Help from another person to put on and taking off regular lower body clothing?: None 6 Click Score: 24   End of Session Nurse Communication: Mobility status;Precautions  Activity Tolerance: Patient tolerated treatment well Patient left: in bed;with call bell/phone within reach;with family/visitor present  OT Visit Diagnosis: Unsteadiness on feet (R26.81)                Time: 1610-9604 OT Time Calculation (min): 12 min Charges:  OT General Charges $OT Visit: 1 Visit OT Evaluation $OT Eval Low Complexity: 1 Low   Mateo Flow, OTR/L  Acute Rehabilitation Services Pager: [phone] Office: [phone] .   Boone Master B 05/12/2018, 3:51 PM

## 2018-05-12 NOTE — Anesthesia Postprocedure Evaluation (Signed)
Anesthesia Post Note  Patient: Angela Sims  Procedure(s) Performed: Posterior lateral fusion - Lumbar five-Sacral one with non-segmental fixation (N/A Back)     Patient location during evaluation: PACU Anesthesia Type: General Level of consciousness: awake and alert Pain management: pain level controlled Vital Signs Assessment: post-procedure vital signs reviewed and stable Respiratory status: spontaneous breathing, nonlabored ventilation, respiratory function stable and patient connected to nasal cannula oxygen Cardiovascular status: blood pressure returned to baseline and stable Postop Assessment: no apparent nausea or vomiting Anesthetic complications: no    Last Vitals:  Vitals:   05/12/18 1345 05/12/18 1418  BP: 116/73 121/65  Pulse: 81 73  Resp: 17 17  Temp: (!) 36.3 C (!) 36.3 C  SpO2: 100% 100%    Last Pain:  Vitals:   05/12/18 1418  TempSrc: Oral  PainSc:     LLE Motor Response: Purposeful movement (05/12/18 1450) LLE Sensation: Full sensation (05/12/18 1450) RLE Motor Response: Purposeful movement (05/12/18 1450) RLE Sensation: Full sensation (05/12/18 1450)      Phillips Grout

## 2018-05-15 ENCOUNTER — Encounter (HOSPITAL_COMMUNITY): Payer: Self-pay | Admitting: Neurological Surgery

## 2018-05-15 MED FILL — Thrombin For Soln Kit 20000 Unit: CUTANEOUS | Qty: 1 | Status: AC

## 2018-10-27 ENCOUNTER — Other Ambulatory Visit: Payer: Self-pay | Admitting: Student

## 2018-10-27 DIAGNOSIS — M542 Cervicalgia: Secondary | ICD-10-CM

## 2018-11-12 ENCOUNTER — Ambulatory Visit
Admission: RE | Admit: 2018-11-12 | Discharge: 2018-11-12 | Disposition: A | Discharge status: Home / Self Care | Payer: Medicare Other | Source: Ambulatory Visit | Attending: Student | Admitting: Student

## 2018-11-12 DIAGNOSIS — M542 Cervicalgia: Secondary | ICD-10-CM

## 2019-10-14 IMAGING — CR DG CHEST 2V
2 series · 2 of 2 positions shown · non-contrast
Comparison: None.

CLINICAL DATA: Pre-admission for back surgery.

EXAM:
CHEST - 2 VIEW

[w chest pa]
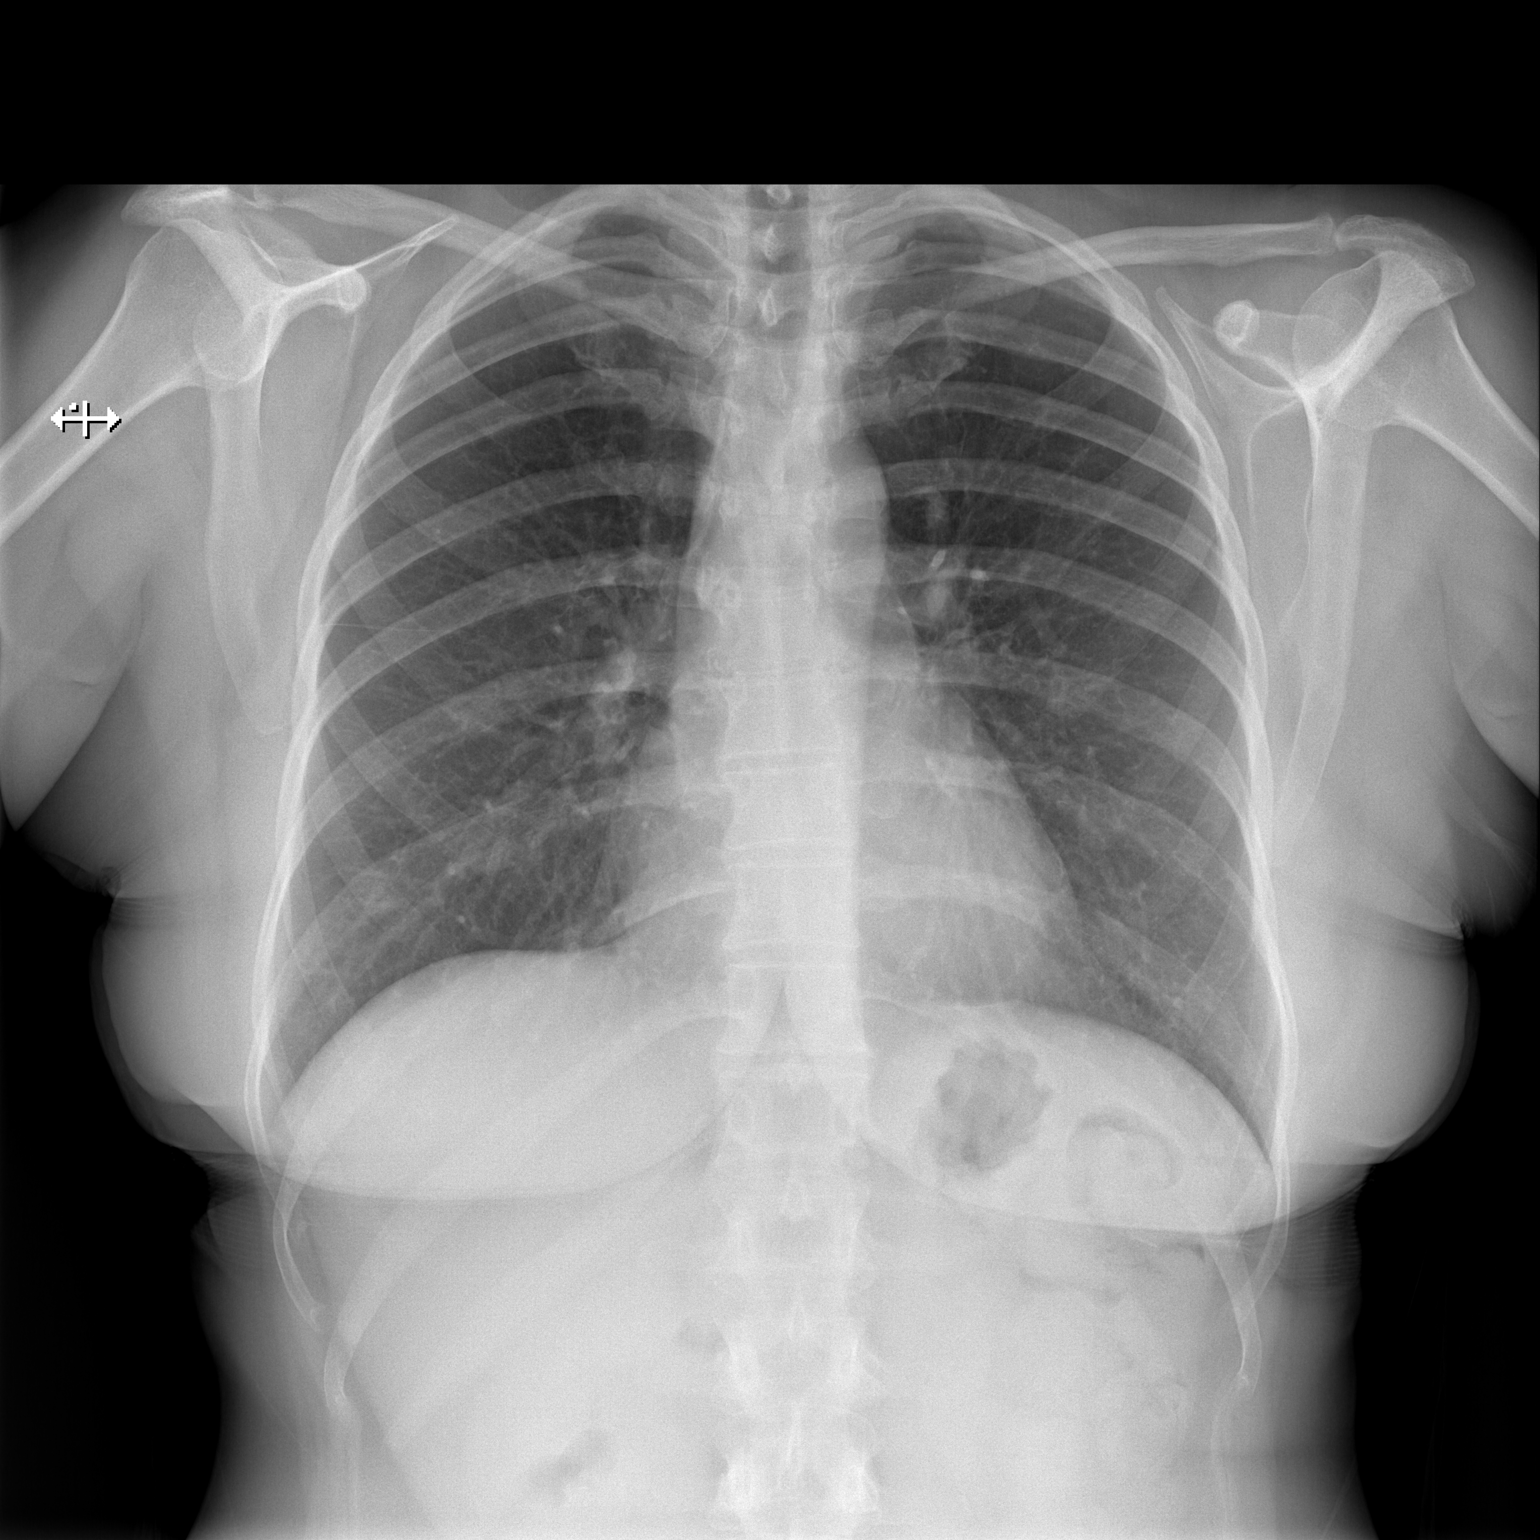

[w chest lat]
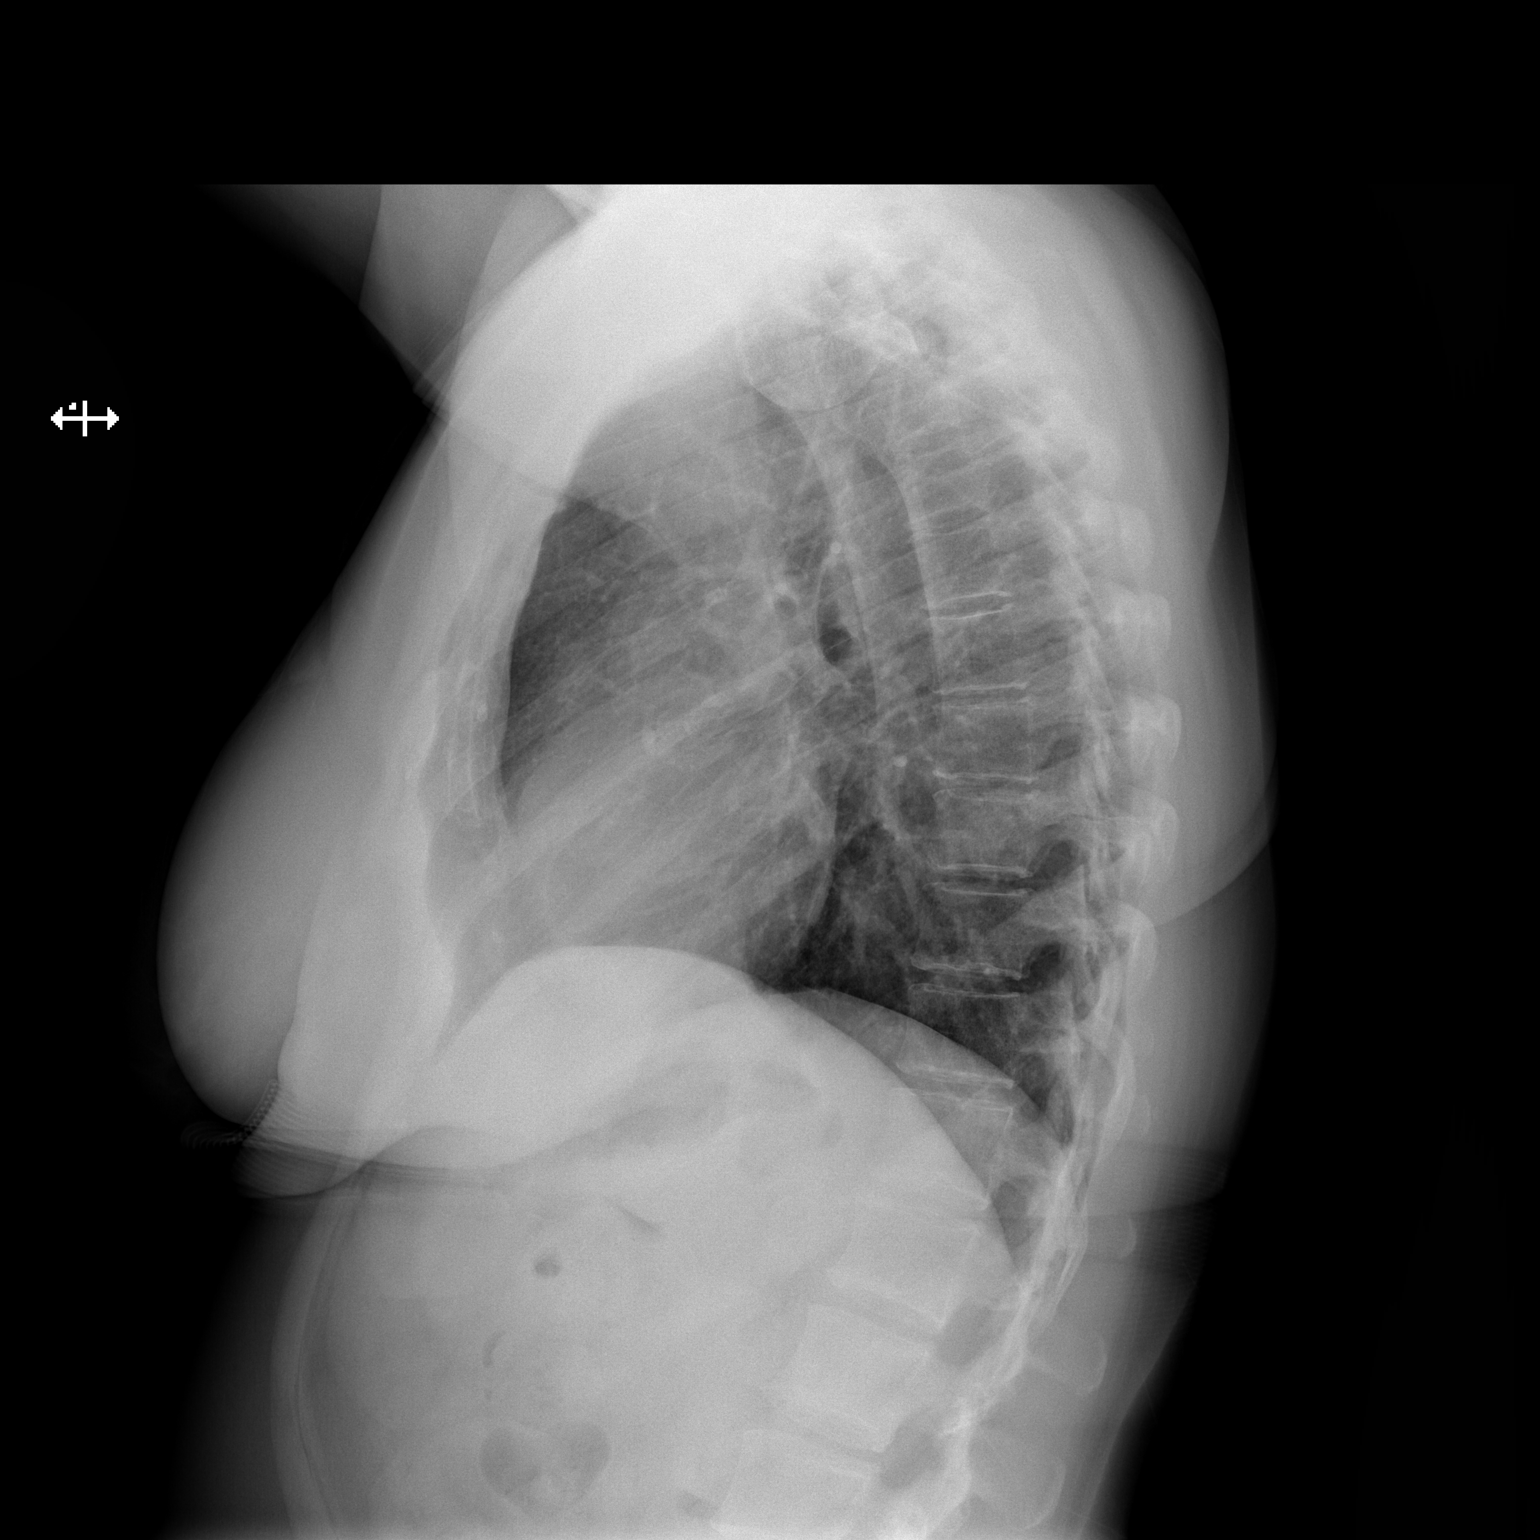

[2 of 2 positions shown; findings below may reference images not displayed]

FINDINGS: The heart size and mediastinal contours are within normal limits.
Both lungs are clear. The visualized skeletal structures are
unremarkable.
IMPRESSION: No active cardiopulmonary disease.

## 2021-09-06 DIAGNOSIS — K760 Fatty (change of) liver, not elsewhere classified: Secondary | ICD-10-CM

## 2021-09-06 HISTORY — DX: Fatty (change of) liver, not elsewhere classified: K76.0

## 2022-08-09 HISTORY — PX: CHOLECYSTECTOMY: SHX55

## 2022-08-26 ENCOUNTER — Other Ambulatory Visit: Payer: Self-pay | Admitting: Neurological Surgery

## 2022-09-22 NOTE — Progress Notes (Addendum)
Surgical Instructions    Your procedure is scheduled on Wednesday, September 29, 2022.  Report to [facility] Main Entrance "A" at 5:30 A.M., then check in with the Admitting office.  Call this number if you have problems the morning of surgery:  [phone]   If you have any questions prior to your surgery date call [phone]: Open Monday-Friday 8am-4pm If you experience any cold or flu symptoms such as cough, fever, chills, shortness of breath, etc. between now and your scheduled surgery, please notify us at the above number     Remember:  Do not eat or drink after midnight the night before your surgery   Take these medicines the morning of surgery with A SIP OF WATER: None   IF Needed:  HYDROcodone-acetaminophen (NORCO/VICODIN)  tiZANidine (ZANAFLEX)  fluticasone (FLONASE)  albuterol (PROVENTIL HFA;VENTOLIN HFA)  Bring with day of surgery   As of today, STOP taking any Aspirin (unless otherwise instructed by your surgeon) Aleve, Naproxen, Ibuprofen, Motrin, Advil, Goody's, BC's, all herbal medications, fish oil, and all vitamins.          Follow your surgeon's instructions on when to stop Upadacitinib ER [facility] ) .  If no instructions were given by your surgeon then you will need to call the office to get those instructions.      Do not wear jewelry or makeup. Do not wear lotions, powders, perfumes/cologne or deodorant. Do not shave 48 hours prior to surgery.  Men may shave face and neck. Do not bring valuables to the hospital. Do not wear nail polish, gel polish, artificial nails, or any other type of covering on natural nails (fingers and toes) If you have artificial nails or gel coating that need to be removed by a nail salon, please have this removed prior to surgery. Artificial nails or gel coating may interfere with anesthesia's ability to adequately monitor your vital signs.  [facility] is not responsible for any belongings or valuables.    Do NOT Smoke  (Tobacco/Vaping)  24 hours prior to your procedure  If you use a CPAP at night, you may bring your mask for your overnight stay.   Contacts, glasses, hearing aids, dentures or partials may not be worn into surgery, please bring cases for these belongings   For patients admitted to the hospital, discharge time will be determined by your treatment team.   Patients discharged the day of surgery will not be allowed to drive home, and someone needs to stay with them for 24 hours.   SURGICAL WAITING ROOM VISITATION Patients having surgery or a procedure may have no more than 2 support people in the waiting area - these visitors may rotate.   Children under the age of 65 must have an adult with them who is not the patient. If the patient needs to stay at the hospital during part of their recovery, the visitor guidelines for inpatient rooms apply. Pre-op nurse will coordinate an appropriate time for 1 support person to accompany patient in pre-op.  This support person may not rotate.   Please refer to [email or web address] for the visitor guidelines for Inpatients (after your surgery is over and you are in a regular room).    Special instructions:    Oral Hygiene is also important to reduce your risk of infection.  Remember - BRUSH YOUR TEETH THE MORNING OF SURGERY WITH YOUR REGULAR TOOTHPASTE   [place]- Preparing For Surgery  Before surgery, you can play an important role. Because skin is not  sterile, your skin needs to be as free of germs as possible. You can reduce the number of germs on your skin by washing with CHG (chlorahexidine gluconate) Soap before surgery.  CHG is an antiseptic cleaner which kills germs and bonds with the skin to continue killing germs even after washing.     Please do not use if you have an allergy to CHG or antibacterial soaps. If your skin becomes reddened/irritated stop using the CHG.  Do not shave (including  legs and underarms) for at least 48 hours prior to first CHG shower. It is OK to shave your face.  Please follow these instructions carefully.     Shower the NIGHT BEFORE SURGERY and the MORNING OF SURGERY with CHG Soap.   If you chose to wash your hair, wash your hair first as usual with your normal shampoo. After you shampoo, rinse your hair and body thoroughly to remove the shampoo.  Then [employer] and genitals (private parts) with your normal soap and rinse thoroughly to remove soap.  After that Use CHG Soap as you would any other liquid soap. You can apply CHG directly to the skin and wash gently with a scrungie or a clean washcloth.   Apply the CHG Soap to your body ONLY FROM THE NECK DOWN.  Do not use on open wounds or open sores. Avoid contact with your eyes, ears, mouth and genitals (private parts). Wash Face and genitals (private parts)  with your normal soap.   Wash thoroughly, paying special attention to the area where your surgery will be performed.  Thoroughly rinse your body with warm water from the neck down.  DO NOT shower/wash with your normal soap after using and rinsing off the CHG Soap.  Pat yourself dry with a CLEAN TOWEL.  Wear CLEAN PAJAMAS to bed the night before surgery  Place CLEAN SHEETS on your bed the night before your surgery  DO NOT SLEEP WITH PETS.   Day of Surgery:  Take a shower with CHG soap. Wear Clean/Comfortable clothing the morning of surgery Do not apply any deodorants/lotions.   Remember to brush your teeth WITH YOUR REGULAR TOOTHPASTE.    If you received a COVID test during your pre-op visit, it is requested that you wear a mask when out in public, stay away from anyone that may not be feeling well, and notify your surgeon if you develop symptoms. If you have been in contact with anyone that has tested positive in the last 10 days, please notify your surgeon.    Please read over the following fact sheets that you were given.

## 2022-09-23 ENCOUNTER — Encounter (HOSPITAL_COMMUNITY)
Admission: RE | Admit: 2022-09-23 | Discharge: 2022-09-23 | Disposition: A | Discharge status: Home / Self Care | Payer: Medicare Other | Source: Ambulatory Visit | Attending: Neurological Surgery | Admitting: Neurological Surgery

## 2022-09-23 ENCOUNTER — Other Ambulatory Visit: Payer: Self-pay

## 2022-09-23 ENCOUNTER — Encounter (HOSPITAL_COMMUNITY): Payer: Self-pay | Admitting: *Deleted

## 2022-09-23 VITALS — BP 140/80 | HR 67 | Temp 98.6°F | Resp 16 | Ht 62.0 in | Wt 138.0 lb

## 2022-09-23 DIAGNOSIS — Z01812 Encounter for preprocedural laboratory examination: Secondary | ICD-10-CM | POA: Diagnosis present

## 2022-09-23 DIAGNOSIS — K760 Fatty (change of) liver, not elsewhere classified: Secondary | ICD-10-CM

## 2022-09-23 DIAGNOSIS — Z01818 Encounter for other preprocedural examination: Secondary | ICD-10-CM

## 2022-09-23 HISTORY — DX: Unspecified osteoarthritis, unspecified site: M19.90

## 2022-09-23 LAB — COMPREHENSIVE METABOLIC PANEL
ALT: 20 U/L (ref 0–44)
AST: 28 U/L (ref 15–41)
Albumin: 4.4 g/dL (ref 3.5–5.0)
Alkaline Phosphatase: 62 U/L (ref 38–126)
Anion gap: 11 (ref 5–15)
BUN: 21 mg/dL — ABNORMAL HIGH (ref 6–20)
CO2: 24 mmol/L (ref 22–32)
Calcium: 9.1 mg/dL (ref 8.9–10.3)
Chloride: 101 mmol/L (ref 98–111)
Creatinine, Ser: 0.96 mg/dL (ref 0.44–1.00)
GFR, Estimated: >60 mL/min (ref >60)
Glucose, Bld: 94 mg/dL (ref 70–99)
Potassium: 4 mmol/L (ref 3.5–5.1)
Sodium: 136 mmol/L (ref 135–145)
Total Bilirubin: 0.6 mg/dL (ref 0.3–1.2)
Total Protein: 8.2 g/dL — ABNORMAL HIGH (ref 6.5–8.1)

## 2022-09-23 LAB — PROTIME-INR
INR: 1 (ref 0.8–1.2)
Prothrombin Time: 12.7 seconds (ref 11.4–15.2)

## 2022-09-23 LAB — SURGICAL PCR SCREEN
MRSA, PCR: NEGATIVE
Staphylococcus aureus: NEGATIVE

## 2022-09-23 LAB — CBC
HCT: 41.3 % (ref 36.0–46.0)
Hemoglobin: 13.8 g/dL (ref 12.0–15.0)
MCH: 30.7 pg (ref 26.0–34.0)
MCHC: 33.4 g/dL (ref 30.0–36.0)
MCV: 91.8 fL (ref 80.0–100.0)
Platelets: 430 10*3/uL — ABNORMAL HIGH (ref 150–400)
RBC: 4.5 MIL/uL (ref 3.87–5.11)
RDW: 14.1 % (ref 11.5–15.5)
WBC: 11.6 10*3/uL — ABNORMAL HIGH (ref 4.0–10.5)
nRBC: 0 % (ref 0.0–0.2)

## 2022-09-23 NOTE — Progress Notes (Signed)
PCP - Georgetta Haber, NP Cardiologist - denies  PPM/ICD - denies   Chest x-ray - 05/03/18 EKG - 05/03/18 Stress Test - denies ECHO - denies Cardiac Cath - denies  Sleep Study - denies   DM- denies  Last dose of GLP1 agonist-  n/a   ASA/Blood Thinner Instructions: n/a   ERAS Protcol - no, NPO   COVID TEST- n/a   Anesthesia review: no  Patient denies shortness of breath, fever, cough and chest pain at PAT appointment   All instructions explained to the patient, with a verbal understanding of the material. Patient agrees to go over the instructions while at home for a better understanding. The opportunity to ask questions was provided.

## 2022-09-28 NOTE — Anesthesia Preprocedure Evaluation (Signed)
Anesthesia Evaluation    Reviewed: Allergy & Precautions, Patient's Chart, lab work & pertinent test results, Unable to perform ROS - Chart review only  History of Anesthesia Complications (+) PONV and history of anesthetic complications  Airway Mallampati: II  TM Distance: >3 FB Neck ROM: Full    Dental no notable dental hx. (+) Dental Advisory Given, Teeth Intact   Pulmonary neg pulmonary ROS   Pulmonary exam normal breath sounds clear to auscultation       Cardiovascular negative cardio ROS Normal cardiovascular exam Rhythm:Regular Rate:Normal     Neuro/Psych  negative psych ROS   GI/Hepatic negative GI ROS, Neg liver ROS,,,  Endo/Other  negative endocrine ROS    Renal/GU negative Renal ROS     Musculoskeletal  (+) Arthritis ,  Fibromyalgia -  Abdominal   Peds  Hematology negative hematology ROS (+) 13/41   Anesthesia Other Findings   Reproductive/Obstetrics negative OB ROS                              Anesthesia Physical Anesthesia Plan  ASA: 2  Anesthesia Plan: General   Post-op Pain Management: Tylenol PO (pre-op)*   Induction: Intravenous  PONV Risk Score and Plan: 4 or greater and Ondansetron, Dexamethasone, Midazolam, Scopolamine patch - Pre-op, Treatment may vary due to age or medical condition and Propofol infusion  Airway Management Planned: Oral ETT and Video Laryngoscope Planned  Additional Equipment:   Intra-op Plan:   Post-operative Plan: Extubation in OR  Informed Consent: I have reviewed the patients History and Physical, chart, labs and discussed the procedure including the risks, benefits and alternatives for the proposed anesthesia with the patient or authorized representative who has indicated his/her understanding and acceptance.     Dental advisory given  Plan Discussed with: CRNA  Anesthesia Plan Comments:          Anesthesia Quick  Evaluation

## 2022-09-29 ENCOUNTER — Encounter (HOSPITAL_COMMUNITY): Payer: Self-pay | Admitting: Neurological Surgery

## 2022-09-29 ENCOUNTER — Ambulatory Visit (HOSPITAL_COMMUNITY): Payer: Medicare Other

## 2022-09-29 ENCOUNTER — Other Ambulatory Visit: Payer: Self-pay

## 2022-09-29 ENCOUNTER — Ambulatory Visit (HOSPITAL_COMMUNITY): Payer: Medicare Other | Admitting: Anesthesiology

## 2022-09-29 ENCOUNTER — Observation Stay (HOSPITAL_COMMUNITY)
Admission: RE | Admit: 2022-09-29 | Discharge: 2022-09-29 | Disposition: A | Discharge status: Home / Self Care | Payer: Medicare Other | Attending: Neurological Surgery | Admitting: Neurological Surgery

## 2022-09-29 ENCOUNTER — Encounter (HOSPITAL_COMMUNITY)
Admission: RE | Disposition: A | Discharge status: Home / Self Care | Payer: Self-pay | Source: Home / Self Care | Attending: Neurological Surgery

## 2022-09-29 ENCOUNTER — Ambulatory Visit (HOSPITAL_BASED_OUTPATIENT_CLINIC_OR_DEPARTMENT_OTHER): Payer: Medicare Other | Admitting: Anesthesiology

## 2022-09-29 DIAGNOSIS — M50121 Cervical disc disorder at C4-C5 level with radiculopathy: Secondary | ICD-10-CM | POA: Diagnosis not present

## 2022-09-29 DIAGNOSIS — M4722 Other spondylosis with radiculopathy, cervical region: Secondary | ICD-10-CM

## 2022-09-29 DIAGNOSIS — Z981 Arthrodesis status: Secondary | ICD-10-CM

## 2022-09-29 DIAGNOSIS — M50221 Other cervical disc displacement at C4-C5 level: Secondary | ICD-10-CM | POA: Insufficient documentation

## 2022-09-29 HISTORY — PX: ANTERIOR CERVICAL DECOMP/DISCECTOMY FUSION: SHX1161

## 2022-09-29 SURGERY — ANTERIOR CERVICAL DECOMPRESSION/DISCECTOMY FUSION 1 LEVEL
Anesthesia: General

## 2022-09-29 MED ORDER — PHENOL 1.4 % MT LIQD: 1.0000 | OROMUCOSAL | Status: DC | PRN

## 2022-09-29 MED ORDER — FENTANYL CITRATE (PF) 250 MCG/5ML IJ SOLN
INTRAMUSCULAR | Status: AC
  Filled 2022-09-29: qty 5

## 2022-09-29 MED ORDER — VANCOMYCIN HCL IN DEXTROSE 1-5 GM/200ML-% IV SOLN
1000.0000 mg | INTRAVENOUS | Status: AC
  Administered 2022-09-29: 1000 mg via INTRAVENOUS
  Filled 2022-09-29: qty 200

## 2022-09-29 MED ORDER — OXYCODONE HCL 5 MG PO TABS: 5.0000 mg | ORAL_TABLET | Freq: Once | ORAL | Status: AC | PRN

## 2022-09-29 MED ORDER — HYDROMORPHONE HCL 1 MG/ML IJ SOLN
INTRAMUSCULAR | Status: AC
  Filled 2022-09-29: qty 1

## 2022-09-29 MED ORDER — MIDAZOLAM HCL 2 MG/2ML IJ SOLN
INTRAMUSCULAR | Status: DC | PRN
  Administered 2022-09-29: 2 mg via INTRAVENOUS

## 2022-09-29 MED ORDER — VANCOMYCIN HCL 500 MG/100ML IV SOLN
500.0000 mg | Freq: Once | INTRAVENOUS | Status: DC
  Filled 2022-09-29: qty 100

## 2022-09-29 MED ORDER — PROPOFOL 10 MG/ML IV BOLUS
INTRAVENOUS | Status: AC
  Filled 2022-09-29: qty 20

## 2022-09-29 MED ORDER — DEXAMETHASONE SODIUM PHOSPHATE 4 MG/ML IJ SOLN: 4.0000 mg | Freq: Four times a day (QID) | INTRAMUSCULAR | Status: DC

## 2022-09-29 MED ORDER — ONDANSETRON HCL 4 MG/2ML IJ SOLN: 4.0000 mg | Freq: Four times a day (QID) | INTRAMUSCULAR | Status: DC | PRN

## 2022-09-29 MED ORDER — DEXAMETHASONE SODIUM PHOSPHATE 10 MG/ML IJ SOLN
INTRAMUSCULAR | Status: AC
  Filled 2022-09-29: qty 1

## 2022-09-29 MED ORDER — ALBUTEROL SULFATE HFA 108 (90 BASE) MCG/ACT IN AERS: 1.0000 | INHALATION_SPRAY | Freq: Every day | RESPIRATORY_TRACT | Status: DC | PRN

## 2022-09-29 MED ORDER — ACETAMINOPHEN 500 MG PO TABS
500.0000 mg | ORAL_TABLET | Freq: Once | ORAL | Status: AC
  Administered 2022-09-29: 500 mg via ORAL

## 2022-09-29 MED ORDER — HYDROCODONE-ACETAMINOPHEN 5-325 MG PO TABS
1.0000 | ORAL_TABLET | ORAL | Status: DC | PRN
  Administered 2022-09-29: 1 via ORAL
  Filled 2022-09-29: qty 1

## 2022-09-29 MED ORDER — MENTHOL 3 MG MT LOZG: 1.0000 | LOZENGE | OROMUCOSAL | Status: DC | PRN

## 2022-09-29 MED ORDER — ORAL CARE MOUTH RINSE: 15.0000 mL | Freq: Once | OROMUCOSAL | Status: AC

## 2022-09-29 MED ORDER — LACTATED RINGERS IV SOLN: INTRAVENOUS | Status: DC

## 2022-09-29 MED ORDER — FENTANYL CITRATE (PF) 250 MCG/5ML IJ SOLN
INTRAMUSCULAR | Status: DC | PRN
  Administered 2022-09-29 (×3): 50 ug via INTRAVENOUS

## 2022-09-29 MED ORDER — SODIUM CHLORIDE 0.9% FLUSH: 3.0000 mL | Freq: Two times a day (BID) | INTRAVENOUS | Status: DC

## 2022-09-29 MED ORDER — ONDANSETRON HCL 4 MG PO TABS: 4.0000 mg | ORAL_TABLET | Freq: Four times a day (QID) | ORAL | Status: DC | PRN

## 2022-09-29 MED ORDER — OXYCODONE HCL 5 MG/5ML PO SOLN
ORAL | Status: AC
  Filled 2022-09-29: qty 5

## 2022-09-29 MED ORDER — DIPHENHYDRAMINE HCL 50 MG/ML IJ SOLN
INTRAMUSCULAR | Status: DC | PRN
  Administered 2022-09-29: 12.5 mg via INTRAVENOUS

## 2022-09-29 MED ORDER — DEXAMETHASONE SODIUM PHOSPHATE 10 MG/ML IJ SOLN
INTRAMUSCULAR | Status: DC | PRN
  Administered 2022-09-29: 10 mg via INTRAVENOUS

## 2022-09-29 MED ORDER — CHLORHEXIDINE GLUCONATE CLOTH 2 % EX PADS: 6.0000 | MEDICATED_PAD | Freq: Once | CUTANEOUS | Status: DC

## 2022-09-29 MED ORDER — OXYCODONE HCL 5 MG/5ML PO SOLN
5.0000 mg | Freq: Once | ORAL | Status: AC | PRN
  Administered 2022-09-29: 5 mg via ORAL

## 2022-09-29 MED ORDER — MIDAZOLAM HCL 2 MG/2ML IJ SOLN
INTRAMUSCULAR | Status: AC
  Filled 2022-09-29: qty 2

## 2022-09-29 MED ORDER — LIDOCAINE 2% (20 MG/ML) 5 ML SYRINGE
INTRAMUSCULAR | Status: AC
  Filled 2022-09-29: qty 5

## 2022-09-29 MED ORDER — POTASSIUM CHLORIDE IN NACL 20-0.9 MEQ/L-% IV SOLN: INTRAVENOUS | Status: DC

## 2022-09-29 MED ORDER — THROMBIN 5000 UNITS EX SOLR
CUTANEOUS | Status: AC
  Filled 2022-09-29: qty 5000

## 2022-09-29 MED ORDER — HYDROCODONE-ACETAMINOPHEN 5-325 MG PO TABS: 1.0000 | ORAL_TABLET | ORAL | 0 refills | Status: DC | PRN

## 2022-09-29 MED ORDER — SODIUM CHLORIDE 0.9% FLUSH: 3.0000 mL | INTRAVENOUS | Status: DC | PRN

## 2022-09-29 MED ORDER — SENNA 8.6 MG PO TABS
1.0000 | ORAL_TABLET | Freq: Two times a day (BID) | ORAL | Status: DC
  Administered 2022-09-29: 8.6 mg via ORAL
  Filled 2022-09-29: qty 1

## 2022-09-29 MED ORDER — PROMETHAZINE HCL 25 MG/ML IJ SOLN: 6.2500 mg | INTRAMUSCULAR | Status: DC | PRN

## 2022-09-29 MED ORDER — THROMBIN 5000 UNITS EX SOLR
OROMUCOSAL | Status: DC | PRN
  Administered 2022-09-29: 5 mL via TOPICAL

## 2022-09-29 MED ORDER — ROCURONIUM BROMIDE 10 MG/ML (PF) SYRINGE
PREFILLED_SYRINGE | INTRAVENOUS | Status: DC | PRN
  Administered 2022-09-29: 20 mg via INTRAVENOUS
  Administered 2022-09-29: 40 mg via INTRAVENOUS

## 2022-09-29 MED ORDER — LIDOCAINE 2% (20 MG/ML) 5 ML SYRINGE
INTRAMUSCULAR | Status: DC | PRN
  Administered 2022-09-29: 60 mg via INTRAVENOUS

## 2022-09-29 MED ORDER — ONDANSETRON HCL 4 MG/2ML IJ SOLN
INTRAMUSCULAR | Status: AC
  Filled 2022-09-29: qty 2

## 2022-09-29 MED ORDER — 0.9 % SODIUM CHLORIDE (POUR BTL) OPTIME
TOPICAL | Status: DC | PRN
  Administered 2022-09-29: 1000 mL

## 2022-09-29 MED ORDER — SUGAMMADEX SODIUM 200 MG/2ML IV SOLN
INTRAVENOUS | Status: DC | PRN
  Administered 2022-09-29: 200 mg via INTRAVENOUS

## 2022-09-29 MED ORDER — DIPHENHYDRAMINE HCL 50 MG/ML IJ SOLN
INTRAMUSCULAR | Status: AC
  Filled 2022-09-29: qty 1

## 2022-09-29 MED ORDER — TIZANIDINE HCL 4 MG PO TABS
2.0000 mg | ORAL_TABLET | Freq: Three times a day (TID) | ORAL | Status: DC | PRN
  Administered 2022-09-29: 2 mg via ORAL
  Filled 2022-09-29: qty 1

## 2022-09-29 MED ORDER — SODIUM CHLORIDE 0.9 % IV SOLN: 250.0000 mL | INTRAVENOUS | Status: DC

## 2022-09-29 MED ORDER — HYDROMORPHONE HCL 1 MG/ML IJ SOLN
0.2500 mg | INTRAMUSCULAR | Status: DC | PRN
  Administered 2022-09-29 (×2): 0.5 mg via INTRAVENOUS

## 2022-09-29 MED ORDER — DEXAMETHASONE 4 MG PO TABS
4.0000 mg | ORAL_TABLET | Freq: Four times a day (QID) | ORAL | Status: DC
  Administered 2022-09-29: 4 mg via ORAL
  Filled 2022-09-29: qty 1

## 2022-09-29 MED ORDER — PROPOFOL 10 MG/ML IV BOLUS
INTRAVENOUS | Status: DC | PRN
  Administered 2022-09-29: 110 mg via INTRAVENOUS

## 2022-09-29 MED ORDER — PROPOFOL 500 MG/50ML IV EMUL
INTRAVENOUS | Status: DC | PRN
  Administered 2022-09-29: 25 ug/kg/min via INTRAVENOUS

## 2022-09-29 MED ORDER — UPADACITINIB ER 15 MG PO TB24: 15.0000 mg | ORAL_TABLET | Freq: Every day | ORAL | Status: DC

## 2022-09-29 MED ORDER — ACETAMINOPHEN 650 MG RE SUPP: 650.0000 mg | RECTAL | Status: DC | PRN

## 2022-09-29 MED ORDER — BUPIVACAINE HCL (PF) 0.25 % IJ SOLN
INTRAMUSCULAR | Status: DC | PRN
  Administered 2022-09-29: 4 mL

## 2022-09-29 MED ORDER — BUPIVACAINE HCL (PF) 0.25 % IJ SOLN
INTRAMUSCULAR | Status: AC
  Filled 2022-09-29: qty 30

## 2022-09-29 MED ORDER — ONDANSETRON HCL 4 MG/2ML IJ SOLN
INTRAMUSCULAR | Status: DC | PRN
  Administered 2022-09-29: 4 mg via INTRAVENOUS

## 2022-09-29 MED ORDER — CHLORHEXIDINE GLUCONATE 0.12 % MT SOLN
15.0000 mL | Freq: Once | OROMUCOSAL | Status: AC
  Administered 2022-09-29: 15 mL via OROMUCOSAL
  Filled 2022-09-29: qty 15

## 2022-09-29 MED ORDER — ROCURONIUM BROMIDE 10 MG/ML (PF) SYRINGE
PREFILLED_SYRINGE | INTRAVENOUS | Status: AC
  Filled 2022-09-29: qty 10

## 2022-09-29 MED ORDER — TIZANIDINE HCL 2 MG PO TABS: 2.0000 mg | ORAL_TABLET | Freq: Three times a day (TID) | ORAL | 0 refills | Status: AC | PRN

## 2022-09-29 MED ORDER — ACETAMINOPHEN 500 MG PO TABS
1000.0000 mg | ORAL_TABLET | Freq: Once | ORAL | Status: DC
  Filled 2022-09-29: qty 2

## 2022-09-29 MED ORDER — ACETAMINOPHEN 325 MG PO TABS: 650.0000 mg | ORAL_TABLET | ORAL | Status: DC | PRN

## 2022-09-29 MED ORDER — SCOPOLAMINE 1 MG/3DAYS TD PT72
1.0000 | MEDICATED_PATCH | TRANSDERMAL | Status: DC
  Administered 2022-09-29: 1.5 mg via TRANSDERMAL
  Filled 2022-09-29: qty 1

## 2022-09-29 MED ORDER — AMISULPRIDE (ANTIEMETIC) 5 MG/2ML IV SOLN: 10.0000 mg | Freq: Once | INTRAVENOUS | Status: DC | PRN

## 2022-09-29 MED ORDER — MORPHINE SULFATE (PF) 2 MG/ML IV SOLN: 2.0000 mg | INTRAVENOUS | Status: DC | PRN

## 2022-09-29 SURGICAL SUPPLY — 49 items
BAG COUNTER SPONGE SURGICOUNT (BAG) ×1 IMPLANT
BAND RUBBER #18 3X1/16 STRL (MISCELLANEOUS) ×2 IMPLANT
BASKET BONE COLLECTION (BASKET) IMPLANT
BENZOIN TINCTURE PRP APPL 2/3 (GAUZE/BANDAGES/DRESSINGS) ×1 IMPLANT
BUR CARBIDE MATCH 3.0 (BURR) ×1 IMPLANT
CANISTER SUCT 3000ML PPV (MISCELLANEOUS) ×1 IMPLANT
DERMABOND ADVANCED .7 DNX12 (GAUZE/BANDAGES/DRESSINGS) IMPLANT
DRAPE C-ARM 42X72 X-RAY (DRAPES) ×2 IMPLANT
DRAPE LAPAROTOMY 100X72 PEDS (DRAPES) ×1 IMPLANT
DRAPE MICROSCOPE SLANT 54X150 (MISCELLANEOUS) ×1 IMPLANT
DRSG OPSITE POSTOP 3X4 (GAUZE/BANDAGES/DRESSINGS) IMPLANT
DRSG OPSITE POSTOP 4X6 (GAUZE/BANDAGES/DRESSINGS) IMPLANT
DURAPREP 6ML APPLICATOR 50/CS (WOUND CARE) ×1 IMPLANT
ELECT COATED BLADE 2.86 ST (ELECTRODE) ×1 IMPLANT
ELECT REM PT RETURN 9FT ADLT (ELECTROSURGICAL) ×1
ELECTRODE REM PT RTRN 9FT ADLT (ELECTROSURGICAL) ×1 IMPLANT
GAUZE 4X4 16PLY ~~LOC~~+RFID DBL (SPONGE) IMPLANT
GLOVE BIO SURGEON STRL SZ7 (GLOVE) IMPLANT
GLOVE BIO SURGEON STRL SZ8 (GLOVE) ×1 IMPLANT
GLOVE BIOGEL PI IND STRL 7.0 (GLOVE) IMPLANT
GOWN STRL REUS W/ TWL LRG LVL3 (GOWN DISPOSABLE) IMPLANT
GOWN STRL REUS W/ TWL XL LVL3 (GOWN DISPOSABLE) IMPLANT
GOWN STRL REUS W/TWL 2XL LVL3 (GOWN DISPOSABLE) ×1 IMPLANT
GOWN STRL REUS W/TWL LRG LVL3 (GOWN DISPOSABLE) ×3
GOWN STRL REUS W/TWL XL LVL3 (GOWN DISPOSABLE) ×1
HEMOSTAT POWDER KIT SURGIFOAM (HEMOSTASIS) ×1 IMPLANT
KIT BASIN OR (CUSTOM PROCEDURE TRAY) ×1 IMPLANT
KIT TURNOVER KIT B (KITS) ×1 IMPLANT
NDL HYPO 25X1 1.5 SAFETY (NEEDLE) ×1 IMPLANT
NDL SPNL 20GX3.5 QUINCKE YW (NEEDLE) ×1 IMPLANT
NEEDLE HYPO 25X1 1.5 SAFETY (NEEDLE) ×1 IMPLANT
NEEDLE SPNL 20GX3.5 QUINCKE YW (NEEDLE) ×1 IMPLANT
NS IRRIG 1000ML POUR BTL (IV SOLUTION) ×1 IMPLANT
PACK LAMINECTOMY NEURO (CUSTOM PROCEDURE TRAY) ×1 IMPLANT
PAD ARMBOARD 7.5X6 YLW CONV (MISCELLANEOUS) ×1 IMPLANT
PIN DISTRACTION 14MM (PIN) IMPLANT
PLATE ANT CERV INSG 22 1L (Plate) IMPLANT
PUTTY BONE 1CC (Putty) IMPLANT
SCREW VA SINGLE LEAD 4X14 (Screw) ×4 IMPLANT
SCREW VA SINGLE LEAD 4X14 ST (Screw) IMPLANT
SPACER IDENTITI 7X16X14 7D (Spacer) IMPLANT
SPONGE INTESTINAL PEANUT (DISPOSABLE) ×1 IMPLANT
SPONGE SURGIFOAM ABS GEL SZ50 (HEMOSTASIS) IMPLANT
STRIP CLOSURE SKIN 1/2X4 (GAUZE/BANDAGES/DRESSINGS) ×1 IMPLANT
SUT VIC AB 3-0 SH 8-18 (SUTURE) ×1 IMPLANT
SUT VICRYL 4-0 PS2 18IN ABS (SUTURE) IMPLANT
TOWEL GREEN STERILE (TOWEL DISPOSABLE) ×1 IMPLANT
TOWEL GREEN STERILE FF (TOWEL DISPOSABLE) ×1 IMPLANT
WATER STERILE IRR 1000ML POUR (IV SOLUTION) ×1 IMPLANT

## 2022-09-29 NOTE — H&P (Signed)
Subjective:   Patient is a 52 y.o. female admitted for acdf. The patient first presented to me with complaints of neck pain and shooting pains in the arm(s). Onset of symptoms was several months ago. The pain is described as aching and occurs all day. The pain is rated severe, and is located in the neck and radiates to the arms. The symptoms have been progressive. Symptoms are exacerbated by extending head backwards, and are relieved by none.  Previous work up includes MRI of cervical spine, results: spinal stenosis.  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   High cholesterol    Myofascial pain syndrome    PONV (postoperative nausea and vomiting) 12/05/2015   Pseudoarthrosis of lumbar spine    Restless leg syndrome    Seasonal allergies     Past Surgical History:  Procedure Laterality Date   ABDOMINAL EXPOSURE N/A 12/05/2015   Procedure: ABDOMINAL EXPOSURE;  Surgeon: Larina Earthly, MD;  Location: [facility];  Service: Vascular;  Laterality: N/A;   ANTERIOR LUMBAR FUSION N/A 12/05/2015   Procedure: ANTERIOR LUMBAR FUSION LUMBAR FIVE-SACRAL ONE ;  Surgeon: Tia Alert, MD;  Location: [facility];  Service: Neurosurgery;  Laterality: N/A;  Abdominal approach   CERVICAL DISC ARTHROPLASTY N/A 01/22/2016   Procedure: C5-6 Cervical artificial disc replacment;  Surgeon: Tia Alert, MD;  Location: [facility];  Service: Neurosurgery;  Laterality: N/A;  C5-6 Cervical artificial disc replacment   CHOLECYSTECTOMY  08/09/2022   LAMINECTOMY WITH POSTERIOR LATERAL ARTHRODESIS LEVEL 1 N/A 05/12/2018   Procedure: Posterior lateral fusion - Lumbar five-Sacral one with non-segmental fixation;  Surgeon: Tia Alert, MD;  Location: [facility];  Service: Neurosurgery;  Laterality: N/A;   LUMBAR FUSION  12/05/2015   LUMBAR LAMINECTOMY/DECOMPRESSION  MICRODISCECTOMY Right 06/25/2015   Procedure: LUMBAR LAMINECTOMY/DECOMPRESSION MICRODISCECTOMY 1 LEVEL; RIGHT ;  Surgeon: Tia Alert, MD;  Location: [facility];  Service: Neurosurgery;  Laterality: Right;   WISDOM TOOTH EXTRACTION      Allergies  Allergen Reactions   Clavulanic Acid Other (See Comments)    Unknown   Cyclobenzaprine Other (See Comments)    Pt stated, "I got restless leg syndrome with this medication"   Lactose Intolerance (Gi) Other (See Comments)    Gassy / Cramps   Gabapentin Other (See Comments)    moody   Penicillins Rash    Has patient had a PCN reaction causing immediate rash, facial/tongue/throat swelling, SOB or lightheadedness with hypotension: Yes Has patient had a PCN reaction causing severe rash involving mucus membranes or skin necrosis: No Has patient had a PCN reaction that required hospitalization: No Has patient had a PCN reaction occurring within the last 10 years: Yes If all of the above answers are "NO", then may proceed with Cephalosporin use.    Social History   Tobacco Use   Smoking status: Never   Smokeless tobacco: Never  Substance Use Topics   Alcohol use: Not Currently    Family History  Problem Relation Age of Onset   Bladder Cancer Mother    Alcohol abuse Father    Prior to Admission medications   Medication Sig Start Date End Date Taking? Authorizing Provider  albuterol (PROVENTIL HFA;VENTOLIN HFA) 108 (90 BASE) MCG/ACT inhaler Inhale 1-2 puffs into the lungs daily as  needed for wheezing or shortness of breath.    Yes [provider]  fluticasone (FLONASE) 50 MCG/ACT nasal spray Place 1 spray into both nostrils daily as needed for allergies.    Yes [provider]  HYDROcodone-acetaminophen (NORCO/VICODIN) 5-325 MG tablet Take 1 tablet by mouth 3 (three) times daily as needed (Chronic Pain). 09/15/22 10/15/22 Yes [provider]  rosuvastatin (CRESTOR) 5 MG tablet Take 5 mg by mouth at bedtime. 07/23/22   Yes [provider]  tiZANidine (ZANAFLEX) 4 MG tablet Take 2 mg by mouth daily as needed (Neck pain).   Yes [provider]  traZODone (DESYREL) 50 MG tablet Take 50-100 mg by mouth at bedtime.   Yes [provider]  Upadacitinib ER (RINVOQ) 15 MG TB24 Take 15 mg by mouth daily.   Yes [provider]     Review of Systems  Positive ROS: neg  All other systems have been reviewed and were otherwise negative with the exception of those mentioned in the HPI and as above.  Objective: Vital signs in last 24 hours: Temp:  [98.3 F (36.8 C)] 98.3 F (36.8 C) (01/24 0602) Pulse Rate:  [74] 74 (01/24 0602) Resp:  [18] 18 (01/24 0602) BP: (139)/(93) 139/93 (01/24 0602) SpO2:  [98 %] 98 % (01/24 0602) Weight:  [62.1 kg] 62.1 kg (01/24 0602)  General Appearance: Alert, cooperative, no distress, appears stated age Head: Normocephalic, without obvious abnormality, atraumatic Eyes: PERRL, conjunctiva/corneas clear, EOM's intact      Neck: Supple, symmetrical, trachea midline, Back: Symmetric, no curvature, ROM normal, no CVA tenderness Lungs:  respirations unlabored Heart: Regular rate and rhythm Abdomen: Soft, non-tender Extremities: Extremities normal, atraumatic, no cyanosis or edema Pulses: 2+ and symmetric all extremities Skin: Skin color, texture, turgor normal, no rashes or lesions  NEUROLOGIC:  Mental status: Alert and oriented x4, no aphasia, good attention span, fund of knowledge and memory  Motor Exam - grossly normal Sensory Exam - grossly normal Reflexes: 1+_ Coordination - grossly normal Gait - grossly normal Balance - grossly normal Cranial Nerves: I: smell Not tested  II: visual acuity  OS: nl    OD: nl  II: visual fields Full to confrontation  II: pupils Equal, round, reactive to light  III,VII: ptosis None  III,IV,VI: extraocular muscles  Full ROM  V: mastication Normal  V: facial light touch sensation  Normal  V,VII: corneal  reflex  Present  VII: facial muscle function - upper  Normal  VII: facial muscle function - lower Normal  VIII: hearing Not tested  IX: soft palate elevation  Normal  IX,X: gag reflex Present  XI: trapezius strength  5/5  XI: sternocleidomastoid strength 5/5  XI: neck flexion strength  5/5  XII: tongue strength  Normal    Data Review Lab Results  Component Value Date   WBC 11.6 (H) 09/23/2022   HGB 13.8 09/23/2022   HCT 41.3 09/23/2022   MCV 91.8 09/23/2022   PLT 430 (H) 09/23/2022   Lab Results  Component Value Date   NA 136 09/23/2022   K 4.0 09/23/2022   CL 101 09/23/2022   CO2 24 09/23/2022   BUN 21 (H) 09/23/2022   CREATININE 0.96 09/23/2022   GLUCOSE 94 09/23/2022   Lab Results  Component Value Date   INR 1.0 09/23/2022    Assessment:   Cervical neck pain with herniated nucleus pulposus/ spondylosis/ stenosis at C4-5. Estimated body mass index is 25.06 kg/m as calculated from the following:  Height as of this encounter: 5\' 2"  (1.575 m).   Weight as of this encounter: 62.1 kg.  Patient has failed conservative therapy. Planned surgery : acdf C4-5  Plan:   I explained the condition and procedure to the patient and answered any questions.  Patient wishes to proceed with procedure as planned. Understands risks/ benefits/ and expected or typical outcomes.  Angela Sims 09/29/2022 6:40 AM

## 2022-09-29 NOTE — Anesthesia Postprocedure Evaluation (Signed)
Anesthesia Post Note  Patient: Angela Sims  Procedure(s) Performed: Anterior Cervical Discectomy Fusion Cervical Four-Cervical Five     Patient location during evaluation: PACU Anesthesia Type: General Level of consciousness: sedated and patient cooperative Pain management: pain level controlled Vital Signs Assessment: post-procedure vital signs reviewed and stable Respiratory status: spontaneous breathing Cardiovascular status: stable Anesthetic complications: no   No notable events documented.  Last Vitals:  Vitals:   09/29/22 0930 09/29/22 0945  BP: (!) 152/79 (!) 163/86  Pulse: 70 65  Resp: 18 16  Temp:    SpO2: 99% 98%    Last Pain:  Vitals:   09/29/22 0945  TempSrc:   PainSc: 8     LLE Motor Response: Purposeful movement (09/29/22 0945) LLE Sensation: Full sensation (09/29/22 0945) RLE Motor Response: Purposeful movement (09/29/22 0945) RLE Sensation: Full sensation (09/29/22 0945)      Nolon Nations

## 2022-09-29 NOTE — Plan of Care (Signed)
Pt doing well. Pt and husband given D/C instructions with verbal understanding. Rx's were sent to the pharmacy by MD. Pt's incision is clean and dry with no sign of infection. Pt's IV was removed prior to D/C. Pt D/C'd home via wheelchair per MD order. Pt is stable @ D/C and has no other needs at this time. Ridgely Anastacio, RN  

## 2022-09-29 NOTE — Transfer of Care (Signed)
Immediate Anesthesia Transfer of Care Note  Patient: Angela Sims  Procedure(s) Performed: Anterior Cervical Discectomy Fusion Cervical Four-Cervical Five  Patient Location: PACU  Anesthesia Type:General  Level of Consciousness: drowsy  Airway & Oxygen Therapy: Patient Spontanous Breathing and Patient connected to face mask oxygen  Post-op Assessment: Report given to RN, Post -op Vital signs reviewed and stable, and Patient moving all extremities X 4  Post vital signs: Reviewed and stable  Last Vitals:  Vitals Value Taken Time  BP 117/84 09/29/22 0910  Temp    Pulse 81 09/29/22 0911  Resp 13 09/29/22 0911  SpO2 100 % 09/29/22 0911  Vitals shown include unvalidated device data.  Last Pain:  Vitals:   09/29/22 0614  TempSrc:   PainSc: 0-No pain         Complications: No notable events documented.

## 2022-09-29 NOTE — Op Note (Signed)
09/29/2022  8:56 AM  PATIENT:  Angela Sims  52 y.o. 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 Decompressive anterior cervical discectomy C4-5, 2. Anterior cervical arthrodesis for C4-5 utilizing a PTI interbody cage packed with locally harvested morcellized autologous bone graft and 1 cc DBM putty, 3. Anterior cervical plating C4-5 utilizing a Alphatec plate  SURGEON:  Sherley Bounds, MD  ASSISTANTS: Margo Aye FNP  ANESTHESIA:   General  EBL: 25 ml  Total I/O In: -  Out: 25 [Blood:25]  BLOOD ADMINISTERED: none  DRAINS: none  SPECIMEN:  none  INDICATION FOR PROCEDURE: This patient presented with neck pain with left arm pain. Imaging showed herniated disc C4-5 on the left. The patient tried conservative measures without relief. Pain was debilitating. Recommended ACDF with plating. Patient understood the risks, benefits, and alternatives and potential outcomes and wished to proceed.  PROCEDURE DETAILS: Patient was brought to the operating room placed under general endotracheal anesthesia. Patient was placed in the supine position on the operating room table. The neck was prepped with Duraprep and draped in a sterile fashion.   Three cc of local anesthesia was injected and a transverse incision was made on the right side of the neck.  Dissection was carried down thru the subcutaneous tissue and the platysma was  elevated, opened, and undermined with Metzenbaum scissors.  Dissection was then carried out thru an avascular plane leaving the sternocleidomastoid carotid artery and jugular vein laterally and the trachea and esophagus medially with the assistance of my nurse practitioner. The ventral aspect of the vertebral column was identified and a localizing x-ray was taken. The C4-5 level was identified and all in the room agreed with the level.  The longus colli muscles were then elevated and the retractor was placed with the assistance of my nurse practitioner. The annulus was incised and the disc space entered. Discectomy was performed with micro-curettes and pituitary rongeurs. I then used the high-speed drill to drill the endplates down to the level of the posterior longitudinal ligament. The drill shavings were saved in a mucous trap for later arthrodesis. The operating microscope was draped and brought into the field provided additional magnification, illumination and visualization. Discectomy was continued posteriorly thru the disc space. Posterior longitudinal ligament was opened with a nerve hook, and then removed along with disc herniation and osteophytes, decompressing the spinal canal and thecal sac. We then continued to remove osteophytic overgrowth and disc material decompressing the neural foramina and exiting nerve roots bilaterally. The scope was angled up and down to help decompress and undercut the vertebral bodies. Once the decompression was completed we could pass a nerve hook circumferentially to assure adequate decompression in the midline and in the neural foramina. So by both visualization and palpation we felt we had an adequate decompression of the neural elements. We then measured the height of the intravertebral disc space and selected a 7 millimeter PTI interbody cage packed with autograft and DBM putty. It was then gently positioned in the intravertebral disc space(s) and countersunk. I then used a 2 mm Alphatec plate and placed 14 mm variable angle screws into the vertebral bodies of each level and locked them into position. The wound was irrigated with bacitracin solution, checked for hemostasis which was established and confirmed. Once meticulous hemostasis was achieved, we then proceeded with closure with the assistance of my nurse practitioner. The  platysma was closed with interrupted 3-0 undyed Vicryl suture, the  subcuticular layer was closed with interrupted 3-0 undyed Vicryl suture. The skin edges were approximated with steristrips. The drapes were removed. A sterile dressing was applied. The patient was then awakened from general anesthesia and transferred to the recovery room in stable condition. At the end of the procedure all sponge, needle and instrument counts were correct.   PLAN OF CARE: Admit for overnight observation  PATIENT DISPOSITION:  PACU - hemodynamically stable.   Delay start of Pharmacological VTE agent (>24hrs) due to surgical blood loss or risk of bleeding:  yes

## 2022-09-29 NOTE — Discharge Summary (Signed)
Physician Discharge Summary  Patient ID: Angela Sims MRN: [id number] DOB/AGE: 12/04/1970 52 y.o.  Admit date: 09/29/2022 Discharge date: 09/29/2022  Admission Diagnoses: cervical disk herniation with radiculopathy   Discharge Diagnoses: same   Discharged Condition: good  Hospital Course: The patient was admitted on 09/29/2022 and taken to the operating room where the patient underwent ACDF C4-5. The patient tolerated the procedure well and was taken to the recovery room and then to the floor in stable condition. The hospital course was routine. There were no complications. The wound remained clean dry and intact. Pt had appropriate neck soreness. No complaints of arm pain or new N/T/W. The patient remained afebrile with stable vital signs, and tolerated a regular diet. The patient continued to increase activities, and pain was well controlled with oral pain medications.   Consults: None  Significant Diagnostic Studies:  Results for orders placed or performed during the hospital encounter of 09/23/22  Surgical pcr screen   Specimen: Nasal Mucosa; Nasal Swab  Result Value Ref Range   MRSA, PCR NEGATIVE NEGATIVE   Staphylococcus aureus NEGATIVE NEGATIVE  Protime-INR  Result Value Ref Range   Prothrombin Time 12.7 11.4 - 15.2 seconds   INR 1.0 0.8 - 1.2  Comprehensive metabolic panel per protocol  Result Value Ref Range   Sodium 136 135 - 145 mmol/L   Potassium 4.0 3.5 - 5.1 mmol/L   Chloride 101 98 - 111 mmol/L   CO2 24 22 - 32 mmol/L   Glucose, Bld 94 70 - 99 mg/dL   BUN 21 (H) 6 - 20 mg/dL   Creatinine, Ser 0.96 0.44 - 1.00 mg/dL   Calcium 9.1 8.9 - 10.3 mg/dL   Total Protein 8.2 (H) 6.5 - 8.1 g/dL   Albumin 4.4 3.5 - 5.0 g/dL   AST 28 15 - 41 U/L   ALT 20 0 - 44 U/L   Alkaline Phosphatase 62 38 - 126 U/L   Total Bilirubin 0.6 0.3 - 1.2 mg/dL   GFR, Estimated >60 >60 mL/min   Anion gap 11 5 - 15  CBC per protocol  Result Value Ref Range   WBC 11.6 (H) 4.0 - 10.5  K/uL   RBC 4.50 3.87 - 5.11 MIL/uL   Hemoglobin 13.8 12.0 - 15.0 g/dL   HCT 41.3 36.0 - 46.0 %   MCV 91.8 80.0 - 100.0 fL   MCH 30.7 26.0 - 34.0 pg   MCHC 33.4 30.0 - 36.0 g/dL   RDW 14.1 11.5 - 15.5 %   Platelets 430 (H) 150 - 400 K/uL   nRBC 0.0 0.0 - 0.2 %    DG Cervical Spine 1 View  Result Date: 09/29/2022 CLINICAL DATA:  C4-C5 ACDF EXAM: DG CERVICAL SPINE - 1 VIEW COMPARISON:  07/26/2022 FINDINGS: Cross-table lateral intraoperative images. Indwelling prostheses at C5-C6 and C6-C7 disc spaces. Anterior plate and screws at T2-I7 Metallic probe via anterior approach projects over the anterior aspect of the C4-C5 disc space. Subsequent image shows anterior plate and screws with disc prosthesis at C4-C5. IMPRESSION: Intraoperative images during C4-C5 fusion. Prior C5-C7 disc surgery. Electronically Signed   By: Lavonia Dana M.D.   On: 09/29/2022 09:07   DG C-Arm 1-60 Min-No Report  Result Date: 09/29/2022 Fluoroscopy was utilized by the requesting physician.  No radiographic interpretation.    Antibiotics:  Anti-infectives (From admission, onward)    Start     Dose/Rate Route Frequency Ordered Stop   09/29/22 0600  vancomycin (VANCOCIN) IVPB 1000 mg/200 mL  premix        1,000 mg 200 mL/hr over 60 Minutes Intravenous On call to O.R. 09/29/22 0541 09/29/22 0728       Discharge Exam: Blood pressure (!) 147/79, pulse 71, temperature 98 F (36.7 C), resp. rate 18, height 5\' 2"  (1.575 m), weight 62.1 kg, last menstrual period 04/19/2018, SpO2 100 %. Neurologic: Grossly normal Dressing dry  Discharge Medications:   Allergies as of 09/29/2022       Reactions   Clavulanic Acid Other (See Comments)   Unknown   Cyclobenzaprine Other (See Comments)   Pt stated, "I got restless leg syndrome with this medication"   Lactose Intolerance (gi) Other (See Comments)   Gassy / Cramps   Gabapentin Other (See Comments)   moody   Penicillins Rash   Has patient had a PCN reaction causing  immediate rash, facial/tongue/throat swelling, SOB or lightheadedness with hypotension: Yes Has patient had a PCN reaction causing severe rash involving mucus membranes or skin necrosis: No Has patient had a PCN reaction that required hospitalization: No Has patient had a PCN reaction occurring within the last 10 years: Yes If all of the above answers are "NO", then may proceed with Cephalosporin use.        Medication List     TAKE these medications    albuterol 108 (90 Base) MCG/ACT inhaler Commonly known as: VENTOLIN HFA Inhale 1-2 puffs into the lungs daily as needed for wheezing or shortness of breath.   fluticasone 50 MCG/ACT nasal spray Commonly known as: FLONASE Place 1 spray into both nostrils daily as needed for allergies.   HYDROcodone-acetaminophen 5-325 MG tablet Commonly known as: NORCO/VICODIN Take 1 tablet by mouth every 4 (four) hours as needed (Chronic Pain). What changed: when to take this   Rinvoq 15 MG Tb24 Generic drug: Upadacitinib ER Take 15 mg by mouth daily.   rosuvastatin 5 MG tablet Commonly known as: CRESTOR Take 5 mg by mouth at bedtime.   tiZANidine 2 MG tablet Commonly known as: ZANAFLEX Take 1 tablet (2 mg total) by mouth every 8 (eight) hours as needed (Neck pain). What changed:  medication strength when to take this   traZODone 50 MG tablet Commonly known as: DESYREL Take 50-100 mg by mouth at bedtime.        Disposition: home   Final Dx: ACDF C4-5  Discharge Instructions      Remove dressing in 72 hours   Complete by: As directed    Call MD for:  difficulty breathing, headache or visual disturbances   Complete by: As directed    Call MD for:  persistant nausea and vomiting   Complete by: As directed    Call MD for:  redness, tenderness, or signs of infection (pain, swelling, redness, odor or green/yellow discharge around incision site)   Complete by: As directed    Call MD for:  severe uncontrolled pain   Complete by:  As directed    Call MD for:  temperature >100.4   Complete by: As directed    Diet - low sodium heart healthy   Complete by: As directed    Increase activity slowly   Complete by: As directed         Follow-up Information     07-23-1973, MD. Schedule an appointment as soon as possible for a visit in 2 week(s).   Specialty: Neurosurgery Contact information: [address] [place] [phone]  Signed: Eustace Moore 09/29/2022, 12:27 PM

## 2022-09-29 NOTE — Anesthesia Procedure Notes (Addendum)
Procedure Name: Intubation Date/Time: 09/29/2022 7:52 AM  Performed by: Heide Scales, CRNAPre-anesthesia Checklist: Patient identified, Emergency Drugs available, Suction available and Patient being monitored Patient Re-evaluated:Patient Re-evaluated prior to induction Oxygen Delivery Method: Circle system utilized Preoxygenation: Pre-oxygenation with 100% oxygen Induction Type: IV induction Ventilation: Mask ventilation without difficulty Laryngoscope Size: Glidescope and 3 Grade View: Grade I Tube type: Oral Tube size: 7.0 mm Number of attempts: 1 Airway Equipment and Method: Stylet Placement Confirmation: ETT inserted through vocal cords under direct vision, positive ETCO2 and breath sounds checked- equal and bilateral Secured at: 21 cm Tube secured with: Tape Dental Injury: Teeth and Oropharynx as per pre-operative assessment  Comments: Elective glidescope intubation

## 2022-10-04 ENCOUNTER — Encounter (HOSPITAL_COMMUNITY): Payer: Self-pay | Admitting: Neurological Surgery

## 2023-02-21 ENCOUNTER — Ambulatory Visit (INDEPENDENT_AMBULATORY_CARE_PROVIDER_SITE_OTHER): Payer: Medicare Other | Admitting: Orthopaedic Surgery

## 2023-02-21 ENCOUNTER — Other Ambulatory Visit (INDEPENDENT_AMBULATORY_CARE_PROVIDER_SITE_OTHER): Payer: Medicare Other

## 2023-02-21 ENCOUNTER — Other Ambulatory Visit: Payer: Self-pay

## 2023-02-21 DIAGNOSIS — M25551 Pain in right hip: Secondary | ICD-10-CM | POA: Diagnosis not present

## 2023-02-21 DIAGNOSIS — M25512 Pain in left shoulder: Secondary | ICD-10-CM | POA: Diagnosis not present

## 2023-02-21 DIAGNOSIS — M25552 Pain in left hip: Secondary | ICD-10-CM | POA: Diagnosis not present

## 2023-02-21 DIAGNOSIS — G8929 Other chronic pain: Secondary | ICD-10-CM

## 2023-02-21 MED ORDER — METHYLPREDNISOLONE ACETATE 40 MG/ML IJ SUSP
40.0000 mg | INTRAMUSCULAR | Status: AC | PRN
  Administered 2023-02-21: 40 mg via INTRA_ARTICULAR

## 2023-02-21 MED ORDER — LIDOCAINE HCL 1 % IJ SOLN
3.0000 mL | INTRAMUSCULAR | Status: AC | PRN
  Administered 2023-02-21: 3 mL

## 2023-02-21 NOTE — Progress Notes (Signed)
The patient is a very pleasant and active 52 year old female sent to me from Dr. Marikay Alar with neurosurgery evaluate and treat bilateral hip pain with the right worse than left.  She points to the lateral aspect of her hips as a source of her pain but does feel like there is just a touch or groin pain on the right side.  This is been slowly getting worse and is very uncomfortable at night specially sleeping on either side.  She says that one of Dr. Yetta Barre colleagues which may be his nurse practitioner has provided steroid injections in both hips which is likely over the trochanteric area which I would do and agree with.  She says the injections did help but then did not last long.  They were helpful at first.  She has never had surgery on either hip.  On exam both hips move smoothly and fluidly with no pain in the groin at all and some mild pain over the trochanteric area of both hips more so on the right than left.  It was definitely little more severe on the right side as I examine her more.  A standing AP pelvis and lateral both hips shows no acute findings with either hip.  The joint spaces well-maintained and I see no cortical irregularity around either trochanteric area.  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  I would like to send her for an MRI of her right hip to assess for any irregularities around that right hip that could be the source of her pain.  This is also warranted given the fact that she has failed conservative treatment including rest, anti-inflammatories, time, therapy and steroid injections.  She agrees with this treatment plan.  At the end of visit she did let me know that she has been having left shoulder pain and that she had had an injection in that left shoulder that lasted over a year.  On exam of the left shoulder she does show some signs of tendinitis and bursitis so I agreed with trying a steroid injection in her  left shoulder today and she did tolerate that well.  We will see about ordering an MRI of her right hip and then see her back in about 4 weeks from now to see how she is doing from her left shoulder injection and to go over the MRI findings of her right hip.  She agrees with this treatment plan.       Procedure Note  Patient: Angela Sims             Date of Birth: Dec 28, 1970           MRN: [id number]             Visit Date: 02/21/2023  Procedures: Visit Diagnoses:  1. Pain of left hip   2. Pain of right hip   3. Chronic left shoulder pain     Large Joint Inj: L subacromial bursa on 02/21/2023 2:00 PM Indications: pain and diagnostic evaluation Details: 22 G 1.5 in needle  Arthrogram: No  Medications: 3 mL lidocaine 1 %; 40 mg methylPREDNISolone acetate 40 MG/ML Outcome: tolerated well, no immediate complications Procedure, treatment alternatives, risks and benefits explained, specific risks discussed. Consent was given by the patient. Immediately prior to procedure a time out was called to verify the correct patient, procedure, equipment, support staff and site/side marked as required. Patient was prepped and draped in  the usual sterile fashion.

## 2023-02-24 ENCOUNTER — Ambulatory Visit
Admission: RE | Admit: 2023-02-24 | Discharge: 2023-02-24 | Disposition: A | Discharge status: Home / Self Care | Payer: Medicare Other | Source: Ambulatory Visit | Attending: Orthopaedic Surgery | Admitting: Orthopaedic Surgery

## 2023-02-24 DIAGNOSIS — M25551 Pain in right hip: Secondary | ICD-10-CM

## 2023-03-02 ENCOUNTER — Other Ambulatory Visit: Payer: Medicare Other

## 2023-03-24 ENCOUNTER — Encounter: Payer: Self-pay | Admitting: Orthopaedic Surgery

## 2023-03-24 ENCOUNTER — Ambulatory Visit: Payer: Medicare Other | Admitting: Orthopaedic Surgery

## 2023-03-24 DIAGNOSIS — M25551 Pain in right hip: Secondary | ICD-10-CM | POA: Diagnosis not present

## 2023-03-24 DIAGNOSIS — M25552 Pain in left hip: Secondary | ICD-10-CM

## 2023-03-24 NOTE — Progress Notes (Signed)
The patient is a 52 year old female who comes in to follow-up after having a MRI of her right hip.  She is a thin individual.  She was referred to me from neurosurgery and Dr. 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  She had had numerous steroid injections and those have not helped her as much.  They had helped just briefly.  She still denies any groin pain at all and the pain is over the lateral aspect of her hips with the right much worse than the left.  On exam again today there is no blocks or rotation of her right hip.  There is no pain in the groin.  Her pain is all over the trochanteric area of that hip.  I did look at the MRI myself.  It does show more fluid around the gluteus medius and minimus tendons and I believe there is at least partial tearing of the tendons there.  The radiologist agrees with that as well and I think it even may be more significant.  The hip itself looks fine to me.  I do not see any cartilage irregularities or issues as a relates to the hip joint.  I would like to send her to my partner Dr. Steward Drone to critically evaluate her gluteus medius and minimus tendon on the right side and then proceed from there in terms of whether or not surgical intervention is warranted.  They agree with this referral/consultation with my partner.

## 2023-04-01 ENCOUNTER — Other Ambulatory Visit (HOSPITAL_BASED_OUTPATIENT_CLINIC_OR_DEPARTMENT_OTHER): Payer: Self-pay | Admitting: Student

## 2023-04-01 ENCOUNTER — Other Ambulatory Visit (HOSPITAL_BASED_OUTPATIENT_CLINIC_OR_DEPARTMENT_OTHER): Payer: Self-pay

## 2023-04-01 ENCOUNTER — Encounter (HOSPITAL_BASED_OUTPATIENT_CLINIC_OR_DEPARTMENT_OTHER): Payer: Self-pay | Admitting: Orthopaedic Surgery

## 2023-04-01 ENCOUNTER — Ambulatory Visit (HOSPITAL_BASED_OUTPATIENT_CLINIC_OR_DEPARTMENT_OTHER): Payer: Self-pay | Admitting: Orthopaedic Surgery

## 2023-04-01 ENCOUNTER — Ambulatory Visit (INDEPENDENT_AMBULATORY_CARE_PROVIDER_SITE_OTHER): Payer: Medicare Other | Admitting: Orthopaedic Surgery

## 2023-04-01 DIAGNOSIS — M67959 Unspecified disorder of synovium and tendon, unspecified thigh: Secondary | ICD-10-CM | POA: Diagnosis not present

## 2023-04-01 MED ORDER — ACETAMINOPHEN 500 MG PO TABS
500.0000 mg | ORAL_TABLET | Freq: Three times a day (TID) | ORAL | 0 refills | Status: AC
  Filled 2023-04-01: qty 30, 10d supply, fill #0

## 2023-04-01 MED ORDER — OXYCODONE HCL 5 MG PO TABS
5.0000 mg | ORAL_TABLET | ORAL | 0 refills | Status: DC | PRN
  Filled 2023-04-01: qty 20, 4d supply, fill #0

## 2023-04-01 MED ORDER — ASPIRIN 325 MG PO TBEC
325.0000 mg | DELAYED_RELEASE_TABLET | Freq: Every day | ORAL | 0 refills | Status: DC
  Filled 2023-04-01: qty 15, 15d supply, fill #0

## 2023-04-01 MED ORDER — IBUPROFEN 800 MG PO TABS
800.0000 mg | ORAL_TABLET | Freq: Three times a day (TID) | ORAL | 0 refills | Status: AC
  Filled 2023-04-01: qty 30, 10d supply, fill #0

## 2023-04-01 NOTE — Progress Notes (Signed)
Chief Complaint: Pain     History of Present Illness:    Angela Sims is a 52 y.o. female presents today with ongoing right hip pain which has been ongoing now for multiple years.  She does have a history distantly of 3 lumbar fusions done with Dr. Yetta Barre.  She states that she has been subsequently getting trochanteric based injections which did initially give her significant relief.  She is also seeing a pain management doctor has been providing trochanteric based injections on the right.  Initially she got many many years of relief but only limited in the most recent injection.  She has been doing physical therapy in the past.  She is on baseline hydrocodone and baclofen.  She does enjoy being active and going for walks with her husband although this is somewhat limited as result of her hip pain.    Surgical History:   As above  PMH/PSH/Family History/Social History/Meds/Allergies:    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   High cholesterol    Myofascial pain syndrome    PONV (postoperative nausea and vomiting) 12/05/2015   Pseudoarthrosis of lumbar spine    Restless leg syndrome    Seasonal allergies    Past Surgical History:  Procedure Laterality Date   ABDOMINAL EXPOSURE N/A 12/05/2015   Procedure: ABDOMINAL EXPOSURE;  Surgeon: Larina Earthly, MD;  Location: [facility];  Service: Vascular;  Laterality: N/A;   ANTERIOR CERVICAL DECOMP/DISCECTOMY FUSION N/A 09/29/2022   Procedure: Anterior Cervical Discectomy Fusion Cervical Four-Cervical Five;  Surgeon: Tia Alert, MD;  Location: [facility] OR;  Service: Neurosurgery;  Laterality: N/A;  3C   ANTERIOR LUMBAR FUSION N/A 12/05/2015   Procedure: ANTERIOR LUMBAR FUSION LUMBAR FIVE-SACRAL ONE ;  Surgeon: Tia Alert, MD;  Location: [facility];   Service: Neurosurgery;  Laterality: N/A;  Abdominal approach   CERVICAL DISC ARTHROPLASTY N/A 01/22/2016   Procedure: C5-6 Cervical artificial disc replacment;  Surgeon: Tia Alert, MD;  Location: [facility];  Service: Neurosurgery;  Laterality: N/A;  C5-6 Cervical artificial disc replacment   CHOLECYSTECTOMY  08/09/2022   LAMINECTOMY WITH POSTERIOR LATERAL ARTHRODESIS LEVEL 1 N/A 05/12/2018   Procedure: Posterior lateral fusion - Lumbar five-Sacral one with non-segmental fixation;  Surgeon: Tia Alert, MD;  Location: [facility] OR;  Service: Neurosurgery;  Laterality: N/A;   LUMBAR FUSION  12/05/2015   LUMBAR LAMINECTOMY/DECOMPRESSION MICRODISCECTOMY Right 06/25/2015   Procedure: LUMBAR LAMINECTOMY/DECOMPRESSION MICRODISCECTOMY 1 LEVEL; RIGHT ;  Surgeon: Tia Alert, MD;  Location: [facility];  Service: Neurosurgery;  Laterality: Right;   WISDOM TOOTH EXTRACTION     Social History   Socioeconomic History   Marital status: Married    Spouse name: Not on file   Number of children: 1   Years of education: Not on file   Highest education level: Not on file  Occupational History   Not on file  Tobacco Use   Smoking status: Never   Smokeless tobacco: Never  Vaping Use   Vaping status: Never Used  Substance and Sexual Activity   Alcohol use: Not Currently   Drug  use: No   Sexual activity: Not on file  Other Topics Concern   Not on file  Social History Narrative   Not on file   Social Determinants of Health   Financial Resource Strain: Low Risk  (10/26/2022)   Received from [facility], [facility]   Overall Financial Resource Strain (CARDIA)    Difficulty of Paying Living Expenses: Not hard at all  Food Insecurity: No Food Insecurity (10/26/2022)   Received from [facility], [facility]   Hunger Vital Sign    Worried About Running Out of Food in the Last Year: Never true    Ran Out of Food in the Last Year: Never true  Transportation Needs: No Transportation Needs  (10/26/2022)   Received from [facility], [facility]   [facility] - Transportation    Lack of Transportation (Medical): No    Lack of Transportation (Non-Medical): No  Physical Activity: Inactive (10/26/2022)   Received from [facility], [facility]   Exercise Vital Sign    Days of Exercise per Week: 0 days    Minutes of Exercise per Session: 30 min  Stress: No Stress Concern Present (10/26/2022)   Received from [facility], [facility] - Occupational Stress Questionnaire    Feeling of Stress : Not at all  Social Connections: Socially Integrated (07/09/2022)   Received from [facility], [facility]   Social Network    How would you rate your social network (family, work, friends)?: Good participation with social networks   Family History  Problem Relation Age of Onset   Bladder Cancer Mother    Alcohol abuse Father    Allergies  Allergen Reactions   Clavulanic Acid Other (See Comments)    Unknown   Cyclobenzaprine Other (See Comments)    Pt stated, "I got restless leg syndrome with this medication"   Lactose Intolerance (Gi) Other (See Comments)    Gassy / Cramps   Gabapentin Other (See Comments)    moody   Penicillins Rash    Has patient had a PCN reaction causing immediate rash, facial/tongue/throat swelling, SOB or lightheadedness with hypotension: Yes Has patient had a PCN reaction causing severe rash involving mucus membranes or skin necrosis: No Has patient had a PCN reaction that required hospitalization: No Has patient had a PCN reaction occurring within the last 10 years: Yes If all of the above answers are "NO", then may proceed with Cephalosporin use.   Current Outpatient Medications  Medication Sig Dispense Refill   acetaminophen (TYLENOL) 500 MG tablet Take 1 tablet (500 mg total) by mouth every 8 (eight) hours for 10 days. 30 tablet 0   albuterol (PROVENTIL HFA;VENTOLIN HFA) 108 (90 BASE) MCG/ACT inhaler  Inhale 1-2 puffs into the lungs daily as needed for wheezing or shortness of breath.      aspirin EC 325 MG tablet Take 1 tablet (325 mg total) by mouth daily. 15 tablet 0   fluticasone (FLONASE) 50 MCG/ACT nasal spray Place 1 spray into both nostrils daily as needed for allergies.      HYDROcodone-acetaminophen (NORCO/VICODIN) 5-325 MG tablet Take 1 tablet by mouth every 4 (four) hours as needed (Chronic Pain). 30 tablet 0   ibuprofen (ADVIL) 800 MG tablet Take 1 tablet (800 mg total) by mouth every 8 (eight) hours for 10 days. Please take with food, please alternate with acetaminophen 30 tablet 0   oxyCODONE (OXY IR/ROXICODONE) 5 MG immediate release tablet Take 1 tablet (5 mg  total) by mouth every 4 (four) hours as needed (severe pain). 20 tablet 0   rosuvastatin (CRESTOR) 5 MG tablet Take 5 mg by mouth at bedtime.     tiZANidine (ZANAFLEX) 2 MG tablet Take 1 tablet (2 mg total) by mouth every 8 (eight) hours as needed (Neck pain). 30 tablet 0   traZODone (DESYREL) 50 MG tablet Take 50-100 mg by mouth at bedtime.     Upadacitinib ER (RINVOQ) 15 MG TB24 Take 15 mg by mouth daily.     No current facility-administered medications for this visit.   No results found.  Review of Systems:   A ROS was performed including pertinent positives and negatives as documented in the HPI.  Physical Exam :   Constitutional: NAD and appears stated age Neurological: Alert and oriented Psych: Appropriate affect and cooperative Last menstrual period 04/19/2018.   Comprehensive Musculoskeletal Exam:    Tenderness to palpation about the greater trochanter with weakness with resisted abduction.  There is radiation into the anterior lateral thigh.  Negative FADIR test.  Range of motion is 30 degrees internal/external rotation of the right hip with positive Trendelenburg on the right  Imaging:   Xray (3 views right hip): Normal  MRI (right hip): There is a gluteus medius tear involving the insertional  junction between the minimus and medius  I personally reviewed and interpreted the radiographs.   Assessment:   52 y.o. 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  At this time she has had countless number of injections over the trochanteric which are no longer providing relief.  At this time given the fact that she has trialed this with physical therapy without relief we did discuss the possibility of gluteus medius repair.  Specifically given the number of injections she has had in the past I do believe she would benefit from patch augmentation given the quality of the likely tissue.  We did discuss the rehab process associated with this.  Did discuss limitations and restrictions associated with this.  After long discussion she has elected for right hip gluteus medius repair with collagen patch augmentation Plan :    -Plan for right hip gluteus medius repair with collagen patch augmentation   After a lengthy discussion of treatment options, including risks, benefits, alternatives, complications of surgical and nonsurgical conservative options, the patient elected surgical repair.   The patient  is aware of the material risks  and complications including, but not limited to injury to adjacent structures, neurovascular injury, infection, numbness, bleeding, implant failure, thermal burns, stiffness, persistent pain, failure to heal, disease transmission from allograft, need for further surgery, dislocation, anesthetic risks, blood clots, risks of death,and others. The probabilities of surgical success and failure discussed with patient given their particular co-morbidities.The time and nature of expected rehabilitation and recovery was discussed.The patient's questions were all answered preoperatively.  No barriers to understanding were noted. I explained the natural history of the disease process and Rx rationale.  I explained to the patient what I considered  to be reasonable expectations given their personal situation.  The final treatment plan was arrived at through a shared patient decision making process model.      I personally saw and evaluated the patient, and participated in the management and treatment plan.  Huel Cote, MD Attending Physician, Orthopedic Surgery  This document was dictated using Dragon voice recognition software. A reasonable attempt at proof reading has been made to minimize errors.

## 2023-04-15 ENCOUNTER — Other Ambulatory Visit (HOSPITAL_BASED_OUTPATIENT_CLINIC_OR_DEPARTMENT_OTHER): Payer: Self-pay | Admitting: Orthopaedic Surgery

## 2023-04-15 DIAGNOSIS — M67959 Unspecified disorder of synovium and tendon, unspecified thigh: Secondary | ICD-10-CM

## 2023-04-25 ENCOUNTER — Encounter (HOSPITAL_BASED_OUTPATIENT_CLINIC_OR_DEPARTMENT_OTHER): Payer: Self-pay | Admitting: Orthopaedic Surgery

## 2023-04-29 ENCOUNTER — Encounter (HOSPITAL_BASED_OUTPATIENT_CLINIC_OR_DEPARTMENT_OTHER): Payer: Self-pay | Admitting: Orthopaedic Surgery

## 2023-04-29 ENCOUNTER — Other Ambulatory Visit: Payer: Self-pay

## 2023-05-06 NOTE — Progress Notes (Signed)

## 2023-05-08 ENCOUNTER — Encounter (HOSPITAL_BASED_OUTPATIENT_CLINIC_OR_DEPARTMENT_OTHER): Payer: Self-pay | Admitting: Orthopaedic Surgery

## 2023-05-10 ENCOUNTER — Ambulatory Visit (HOSPITAL_BASED_OUTPATIENT_CLINIC_OR_DEPARTMENT_OTHER)
Admission: RE | Admit: 2023-05-10 | Discharge: 2023-05-10 | Disposition: A | Discharge status: Home / Self Care | Payer: Medicare Other | Source: Ambulatory Visit | Attending: Orthopaedic Surgery | Admitting: Orthopaedic Surgery

## 2023-05-10 ENCOUNTER — Encounter (HOSPITAL_BASED_OUTPATIENT_CLINIC_OR_DEPARTMENT_OTHER): Payer: Self-pay | Admitting: Orthopaedic Surgery

## 2023-05-10 ENCOUNTER — Other Ambulatory Visit: Payer: Self-pay

## 2023-05-10 ENCOUNTER — Ambulatory Visit (HOSPITAL_BASED_OUTPATIENT_CLINIC_OR_DEPARTMENT_OTHER): Payer: Medicare Other | Admitting: Certified Registered"

## 2023-05-10 ENCOUNTER — Encounter (HOSPITAL_BASED_OUTPATIENT_CLINIC_OR_DEPARTMENT_OTHER)
Admission: RE | Disposition: A | Discharge status: Home / Self Care | Payer: Self-pay | Source: Ambulatory Visit | Attending: Orthopaedic Surgery

## 2023-05-10 DIAGNOSIS — X58XXXA Exposure to other specified factors, initial encounter: Secondary | ICD-10-CM | POA: Diagnosis not present

## 2023-05-10 DIAGNOSIS — M67959 Unspecified disorder of synovium and tendon, unspecified thigh: Secondary | ICD-10-CM

## 2023-05-10 DIAGNOSIS — S76011A Strain of muscle, fascia and tendon of right hip, initial encounter: Secondary | ICD-10-CM

## 2023-05-10 DIAGNOSIS — M67951 Unspecified disorder of synovium and tendon, right thigh: Secondary | ICD-10-CM | POA: Diagnosis not present

## 2023-05-10 DIAGNOSIS — Z981 Arthrodesis status: Secondary | ICD-10-CM | POA: Diagnosis not present

## 2023-05-10 DIAGNOSIS — Z79899 Other long term (current) drug therapy: Secondary | ICD-10-CM | POA: Insufficient documentation

## 2023-05-10 HISTORY — PX: GLUTEUS MINIMUS REPAIR: SHX5843

## 2023-05-10 SURGERY — REPAIR, TENDON, GLUTEUS MINIMUS
Anesthesia: General | Site: Hip | Laterality: Right

## 2023-05-10 MED ORDER — ACETAMINOPHEN 500 MG PO TABS: 500.0000 mg | ORAL_TABLET | Freq: Three times a day (TID) | ORAL | 0 refills | Status: AC

## 2023-05-10 MED ORDER — OXYCODONE HCL 5 MG PO TABS
5.0000 mg | ORAL_TABLET | Freq: Once | ORAL | Status: AC | PRN
  Administered 2023-05-10: 5 mg via ORAL

## 2023-05-10 MED ORDER — CEFAZOLIN SODIUM-DEXTROSE 2-4 GM/100ML-% IV SOLN
INTRAVENOUS | Status: AC
  Filled 2023-05-10: qty 100

## 2023-05-10 MED ORDER — OXYCODONE HCL 5 MG PO CAPS
5.0000 mg | ORAL_CAPSULE | ORAL | 0 refills | Status: AC | PRN
Start: 2023-05-10 — End: ?

## 2023-05-10 MED ORDER — ASPIRIN 325 MG PO TBEC: 325.0000 mg | DELAYED_RELEASE_TABLET | Freq: Every day | ORAL | 0 refills | Status: AC

## 2023-05-10 MED ORDER — LIDOCAINE HCL (CARDIAC) PF 100 MG/5ML IV SOSY
PREFILLED_SYRINGE | INTRAVENOUS | Status: DC | PRN
  Administered 2023-05-10: 80 mg via INTRAVENOUS

## 2023-05-10 MED ORDER — 0.9 % SODIUM CHLORIDE (POUR BTL) OPTIME
TOPICAL | Status: DC | PRN
  Administered 2023-05-10: 200 mL

## 2023-05-10 MED ORDER — PROPOFOL 10 MG/ML IV BOLUS
INTRAVENOUS | Status: AC
  Filled 2023-05-10: qty 20

## 2023-05-10 MED ORDER — DEXAMETHASONE SODIUM PHOSPHATE 10 MG/ML IJ SOLN
INTRAMUSCULAR | Status: AC
  Filled 2023-05-10: qty 1

## 2023-05-10 MED ORDER — FENTANYL CITRATE (PF) 100 MCG/2ML IJ SOLN
50.0000 ug | Freq: Once | INTRAMUSCULAR | Status: AC
  Administered 2023-05-10: 50 ug via INTRAVENOUS

## 2023-05-10 MED ORDER — OXYCODONE HCL 5 MG/5ML PO SOLN: 5.0000 mg | Freq: Once | ORAL | Status: AC | PRN

## 2023-05-10 MED ORDER — FENTANYL CITRATE (PF) 100 MCG/2ML IJ SOLN
INTRAMUSCULAR | Status: AC
  Filled 2023-05-10: qty 2

## 2023-05-10 MED ORDER — SUGAMMADEX SODIUM 200 MG/2ML IV SOLN
INTRAVENOUS | Status: DC | PRN
Start: 2023-05-10 — End: 2023-05-10
  Administered 2023-05-10: 200 mg via INTRAVENOUS

## 2023-05-10 MED ORDER — FENTANYL CITRATE (PF) 100 MCG/2ML IJ SOLN
25.0000 ug | INTRAMUSCULAR | Status: DC | PRN
  Administered 2023-05-10 (×3): 50 ug via INTRAVENOUS

## 2023-05-10 MED ORDER — MEPERIDINE HCL 25 MG/ML IJ SOLN: 6.2500 mg | INTRAMUSCULAR | Status: DC | PRN

## 2023-05-10 MED ORDER — GABAPENTIN 300 MG PO CAPS
300.0000 mg | ORAL_CAPSULE | Freq: Once | ORAL | Status: AC
  Administered 2023-05-10: 300 mg via ORAL

## 2023-05-10 MED ORDER — OXYCODONE HCL 5 MG PO TABS
ORAL_TABLET | ORAL | Status: AC
  Filled 2023-05-10: qty 1

## 2023-05-10 MED ORDER — TRANEXAMIC ACID-NACL 1000-0.7 MG/100ML-% IV SOLN
INTRAVENOUS | Status: AC
  Filled 2023-05-10: qty 100

## 2023-05-10 MED ORDER — PROPOFOL 10 MG/ML IV BOLUS
INTRAVENOUS | Status: DC | PRN
  Administered 2023-05-10: 150 mg via INTRAVENOUS

## 2023-05-10 MED ORDER — LACTATED RINGERS IV SOLN: INTRAVENOUS | Status: DC

## 2023-05-10 MED ORDER — ONDANSETRON HCL 4 MG/2ML IJ SOLN
INTRAMUSCULAR | Status: DC | PRN
  Administered 2023-05-10: 4 mg via INTRAVENOUS

## 2023-05-10 MED ORDER — ACETAMINOPHEN 325 MG PO TABS: 325.0000 mg | ORAL_TABLET | ORAL | Status: DC | PRN

## 2023-05-10 MED ORDER — BUPIVACAINE HCL (PF) 0.5 % IJ SOLN
INTRAMUSCULAR | Status: DC | PRN
  Administered 2023-05-10: 20 mL

## 2023-05-10 MED ORDER — MIDAZOLAM HCL 5 MG/5ML IJ SOLN
INTRAMUSCULAR | Status: DC | PRN
  Administered 2023-05-10: 2 mg via INTRAVENOUS

## 2023-05-10 MED ORDER — MIDAZOLAM HCL 2 MG/2ML IJ SOLN
INTRAMUSCULAR | Status: AC
  Filled 2023-05-10: qty 2

## 2023-05-10 MED ORDER — GABAPENTIN 300 MG PO CAPS
ORAL_CAPSULE | ORAL | Status: AC
  Filled 2023-05-10: qty 1

## 2023-05-10 MED ORDER — ACETAMINOPHEN 500 MG PO TABS
ORAL_TABLET | ORAL | Status: AC
  Filled 2023-05-10: qty 2

## 2023-05-10 MED ORDER — ONDANSETRON HCL 4 MG/2ML IJ SOLN
INTRAMUSCULAR | Status: AC
  Filled 2023-05-10: qty 2

## 2023-05-10 MED ORDER — LIDOCAINE 2% (20 MG/ML) 5 ML SYRINGE
INTRAMUSCULAR | Status: AC
  Filled 2023-05-10: qty 5

## 2023-05-10 MED ORDER — FENTANYL CITRATE (PF) 100 MCG/2ML IJ SOLN
INTRAMUSCULAR | Status: DC | PRN
  Administered 2023-05-10: 100 ug via INTRAVENOUS

## 2023-05-10 MED ORDER — CEFAZOLIN SODIUM-DEXTROSE 2-4 GM/100ML-% IV SOLN
2.0000 g | INTRAVENOUS | Status: AC
  Administered 2023-05-10: 2 g via INTRAVENOUS

## 2023-05-10 MED ORDER — TRANEXAMIC ACID-NACL 1000-0.7 MG/100ML-% IV SOLN
1000.0000 mg | INTRAVENOUS | Status: AC
  Administered 2023-05-10: 1000 mg via INTRAVENOUS

## 2023-05-10 MED ORDER — ACETAMINOPHEN 160 MG/5ML PO SOLN: 325.0000 mg | ORAL | Status: DC | PRN

## 2023-05-10 MED ORDER — ONDANSETRON HCL 4 MG/2ML IJ SOLN: 4.0000 mg | Freq: Once | INTRAMUSCULAR | Status: DC | PRN

## 2023-05-10 MED ORDER — ROCURONIUM BROMIDE 100 MG/10ML IV SOLN
INTRAVENOUS | Status: DC | PRN
  Administered 2023-05-10: 70 mg via INTRAVENOUS

## 2023-05-10 MED ORDER — DEXAMETHASONE SODIUM PHOSPHATE 4 MG/ML IJ SOLN
INTRAMUSCULAR | Status: DC | PRN
  Administered 2023-05-10: 5 mg via INTRAVENOUS

## 2023-05-10 MED ORDER — PROPOFOL 500 MG/50ML IV EMUL
INTRAVENOUS | Status: DC | PRN
Start: 2023-05-10 — End: 2023-05-10
  Administered 2023-05-10: 200 ug/kg/min via INTRAVENOUS

## 2023-05-10 MED ORDER — ACETAMINOPHEN 500 MG PO TABS
1000.0000 mg | ORAL_TABLET | Freq: Once | ORAL | Status: AC
  Administered 2023-05-10: 1000 mg via ORAL

## 2023-05-10 SURGICAL SUPPLY — 56 items
ADH SKN CLS APL DERMABOND .7 (GAUZE/BANDAGES/DRESSINGS) ×1
ANCH SUT KNTLS STRL SHLDR SYS (Anchor) ×2 IMPLANT
ANCHOR JUGGERKNOT SOFT 1.5 (Anchor) ×2 IMPLANT
ANCHOR JUGGERKNOT SOFT 2.9 (Anchor) IMPLANT
ANCHOR SUT QUATTRO KNTLS 4.5 (Anchor) IMPLANT
APL PRP STRL LF DISP 70% ISPRP (MISCELLANEOUS) ×1
BLADE SURG 10 STRL SS (BLADE) ×1 IMPLANT
BLADE SURG 15 STRL LF DISP TIS (BLADE) ×2 IMPLANT
BLADE SURG 15 STRL SS (BLADE) ×2
CANISTER SUCT 1200ML W/VALVE (MISCELLANEOUS) ×1 IMPLANT
CHLORAPREP W/TINT 26 (MISCELLANEOUS) ×1 IMPLANT
CLSR STERI-STRIP ANTIMIC 1/2X4 (GAUZE/BANDAGES/DRESSINGS) ×1 IMPLANT
COOLER ICEMAN CLASSIC (MISCELLANEOUS) ×1 IMPLANT
COVER BACK TABLE 60X90IN (DRAPES) ×1 IMPLANT
COVER MAYO STAND STRL (DRAPES) ×1 IMPLANT
DERMABOND ADVANCED .7 DNX12 (GAUZE/BANDAGES/DRESSINGS) ×1 IMPLANT
DRAPE STERI IOBAN 125X83 (DRAPES) ×1 IMPLANT
DRAPE U-SHAPE 47X51 STRL (DRAPES) ×2 IMPLANT
DRSG AQUACEL AG ADV 3.5X 6 (GAUZE/BANDAGES/DRESSINGS) ×1 IMPLANT
DRSG AQUACEL AG ADV 3.5X10 (GAUZE/BANDAGES/DRESSINGS) IMPLANT
ELECT BLADE 4.0 EZ CLEAN MEGAD (MISCELLANEOUS)
ELECT REM PT RETURN 9FT ADLT (ELECTROSURGICAL) ×1
ELECTRODE BLDE 4.0 EZ CLN MEGD (MISCELLANEOUS) IMPLANT
ELECTRODE REM PT RTRN 9FT ADLT (ELECTROSURGICAL) ×1 IMPLANT
GAUZE PAD ABD 8X10 STRL (GAUZE/BANDAGES/DRESSINGS) IMPLANT
GAUZE XEROFORM 1X8 LF (GAUZE/BANDAGES/DRESSINGS) IMPLANT
GLOVE BIO SURGEON STRL SZ 6 (GLOVE) ×2 IMPLANT
GLOVE BIO SURGEON STRL SZ7.5 (GLOVE) ×2 IMPLANT
GLOVE BIOGEL PI IND STRL 6.5 (GLOVE) ×1 IMPLANT
GLOVE BIOGEL PI IND STRL 8 (GLOVE) ×1 IMPLANT
GOWN STRL REUS W/ TWL LRG LVL3 (GOWN DISPOSABLE) ×2 IMPLANT
GOWN STRL REUS W/TWL LRG LVL3 (GOWN DISPOSABLE) ×1
GOWN STRL REUS W/TWL XL LVL3 (GOWN DISPOSABLE) ×1 IMPLANT
IMPL TAPESTRY BIOINTEGR 40X30 (Mesh General) IMPLANT
MANIFOLD NEPTUNE II (INSTRUMENTS) ×1 IMPLANT
NDL HYPO 22X1.5 SAFETY MO (MISCELLANEOUS) ×1 IMPLANT
NEEDLE HYPO 22X1.5 SAFETY MO (MISCELLANEOUS) ×1 IMPLANT
NS IRRIG 1000ML POUR BTL (IV SOLUTION) ×1 IMPLANT
PACK BASIN DAY SURGERY FS (CUSTOM PROCEDURE TRAY) ×1 IMPLANT
PAD COLD SHLDR WRAP-ON (PAD) ×1 IMPLANT
PENCIL SMOKE EVACUATOR (MISCELLANEOUS) ×1 IMPLANT
SLEEVE SCD COMPRESS KNEE MED (STOCKING) IMPLANT
SPIKE FLUID TRANSFER (MISCELLANEOUS) IMPLANT
SPONGE T-LAP 18X18 ~~LOC~~+RFID (SPONGE) ×1 IMPLANT
SUCTION TUBE FRAZIER 10FR DISP (SUCTIONS) ×1 IMPLANT
SUT ETHILON 3 0 PS 1 (SUTURE) ×1 IMPLANT
SUT MNCRL AB 3-0 PS2 27 (SUTURE) ×1 IMPLANT
SUT VIC AB 0 CT1 27 (SUTURE) ×2
SUT VIC AB 0 CT1 27XBRD ANBCTR (SUTURE) ×1 IMPLANT
SUT VIC AB 2-0 CT1 27 (SUTURE) ×2
SUT VIC AB 2-0 CT1 TAPERPNT 27 (SUTURE) ×1 IMPLANT
SYR 20ML LL LF (SYRINGE) ×1 IMPLANT
SYR BULB EAR ULCER 3OZ GRN STR (SYRINGE) ×1 IMPLANT
TOWEL GREEN STERILE FF (TOWEL DISPOSABLE) ×2 IMPLANT
UNDERPAD 30X36 HEAVY ABSORB (UNDERPADS AND DIAPERS) IMPLANT
YANKAUER SUCT BULB TIP NO VENT (SUCTIONS) ×1 IMPLANT

## 2023-05-10 NOTE — Transfer of Care (Signed)
Immediate Anesthesia Transfer of Care Note  Patient: Angela Sims  Procedure(s) Performed: RIGHT GLUTEUS MEDIUS REPAIR WITH COLLAGEN PATCH AUGMENTATION (Right: Hip)  Patient Location: PACU  Anesthesia Type:General  Level of Consciousness: awake, alert , and oriented  Airway & Oxygen Therapy: Patient Spontanous Breathing and Patient connected to face mask oxygen  Post-op Assessment: Report given to RN and Post -op Vital signs reviewed and stable  Post vital signs: Reviewed and stable  Last Vitals:  Vitals Value Taken Time  BP 100/85 05/10/23 1222  Temp    Pulse 69 05/10/23 1224  Resp 9 05/10/23 1224  SpO2 100 % 05/10/23 1224  Vitals shown include unfiled device data.  Last Pain: There were no vitals filed for this visit.       Complications: No notable events documented.

## 2023-05-10 NOTE — Interval H&P Note (Signed)
History and Physical Interval Note:  05/10/2023 10:42 AM  Angela Sims  has presented today for surgery, with the diagnosis of RIGHT GLUTEUS MEDIUS TEAR.  The various methods of treatment have been discussed with the patient and family. After consideration of risks, benefits and other options for treatment, the patient has consented to  Procedure(s): RIGHT GLUTEUS MEDIUS REPAIR WITH COLLAGEN PATCH AUGMENTATION (Right) as a surgical intervention.  The patient's history has been reviewed, patient examined, no change in status, stable for surgery.  I have reviewed the patient's chart and labs.  Questions were answered to the patient's satisfaction.     Huel Cote

## 2023-05-10 NOTE — H&P (Signed)
Chief Complaint: Pain        History of Present Illness:      Angela Sims is a 52 y.o. female presents today with ongoing right hip pain which has been ongoing now for multiple years.  She does have a history distantly of 3 lumbar fusions done with Dr. Yetta Barre.  She states that she has been subsequently getting trochanteric based injections which did initially give her significant relief.  She is also seeing a pain management doctor has been providing trochanteric based injections on the right.  Initially she got many many years of relief but only limited in the most recent injection.  She has been doing physical therapy in the past.  She is on baseline hydrocodone and baclofen.  She does enjoy being active and going for walks with her husband although this is somewhat limited as result of her hip pain.       Surgical History:   As above   PMH/PSH/Family History/Social History/Meds/Allergies:         Past Medical History:  Diagnosis Date   Arthritis     Complication of anesthesia     Deviated septum      right side   Fatty liver 2023   Fibromyalgia     Herniated cervical disc 12/02/2015   High cholesterol     Myofascial pain syndrome     PONV (postoperative nausea and vomiting) 12/05/2015   Pseudoarthrosis of lumbar spine     Restless leg syndrome     Seasonal allergies               Past Surgical History:  Procedure Laterality Date   ABDOMINAL EXPOSURE N/A 12/05/2015    Procedure: ABDOMINAL EXPOSURE;  Surgeon: Larina Earthly, MD;  Location: [facility];  Service: Vascular;  Laterality: N/A;   ANTERIOR CERVICAL DECOMP/DISCECTOMY FUSION N/A 09/29/2022    Procedure: Anterior Cervical Discectomy Fusion Cervical Four-Cervical Five;  Surgeon: Tia Alert, MD;  Location: [facility] OR;  Service: Neurosurgery;  Laterality: N/A;  3C   ANTERIOR LUMBAR FUSION N/A 12/05/2015    Procedure: ANTERIOR LUMBAR FUSION LUMBAR FIVE-SACRAL ONE ;  Surgeon: Tia Alert, MD;  Location: [facility];  Service: Neurosurgery;  Laterality: N/A;  Abdominal approach   CERVICAL DISC ARTHROPLASTY N/A 01/22/2016    Procedure: C5-6 Cervical artificial disc replacment;  Surgeon: Tia Alert, MD;  Location: [facility];  Service: Neurosurgery;  Laterality: N/A;  C5-6 Cervical artificial disc replacment   CHOLECYSTECTOMY   08/09/2022   LAMINECTOMY WITH POSTERIOR LATERAL ARTHRODESIS LEVEL 1 N/A 05/12/2018    Procedure: Posterior lateral fusion - Lumbar five-Sacral one with non-segmental fixation;  Surgeon: Tia Alert, MD;  Location: [facility] OR;  Service: Neurosurgery;  Laterality: N/A;   LUMBAR FUSION   12/05/2015   LUMBAR LAMINECTOMY/DECOMPRESSION MICRODISCECTOMY Right 06/25/2015    Procedure: LUMBAR LAMINECTOMY/DECOMPRESSION MICRODISCECTOMY 1 LEVEL; RIGHT ;  Surgeon: Tia Alert, MD;  Location: [facility];  Service: Neurosurgery;  Laterality: Right;   WISDOM TOOTH EXTRACTION            Social History         Socioeconomic History   Marital status: Married      Spouse name: Not on file   Number of children: 1   Years of education: Not on file   Highest education level: Not on file  Occupational History   Not on file  Tobacco Use   Smoking  status: Never   Smokeless tobacco: Never  Vaping Use   Vaping status: Never Used  Substance and Sexual Activity   Alcohol use: Not Currently   Drug use: No   Sexual activity: Not on file  Other Topics Concern   Not on file  Social History Narrative   Not on file    Social Determinants of Health        Financial Resource Strain: Low Risk  (10/26/2022)    Received from [facility], [facility]    Overall Financial Resource Strain (CARDIA)     Difficulty of Paying Living Expenses: Not hard at all  Food Insecurity: No Food Insecurity (10/26/2022)    Received from [facility], [facility]    Hunger Vital Sign     Worried About Running Out of Food in the Last Year: Never true     Ran Out of Food in  the Last Year: Never true  Transportation Needs: No Transportation Needs (10/26/2022)    Received from [facility], [facility]    PRAPARE - Transportation     Lack of Transportation (Medical): No     Lack of Transportation (Non-Medical): No  Physical Activity: Inactive (10/26/2022)    Received from [facility], [facility]    Exercise Vital Sign     Days of Exercise per Week: 0 days     Minutes of Exercise per Session: 30 min  Stress: No Stress Concern Present (10/26/2022)    Received from [facility], [facility] - Occupational Stress Questionnaire     Feeling of Stress : Not at all  Social Connections: Socially Integrated (07/09/2022)    Received from [facility], [facility]    Social Network     How would you rate your social network (family, work, friends)?: Good participation with social networks         Family History  Problem Relation Age of Onset   Bladder Cancer Mother     Alcohol abuse Father          Allergies       Allergies  Allergen Reactions   Clavulanic Acid Other (See Comments)      Unknown   Cyclobenzaprine Other (See Comments)      Pt stated, "I got restless leg syndrome with this medication"   Lactose Intolerance (Gi) Other (See Comments)      Gassy / Cramps   Gabapentin Other (See Comments)      moody   Penicillins Rash      Has patient had a PCN reaction causing immediate rash, facial/tongue/throat swelling, SOB or lightheadedness with hypotension: Yes Has patient had a PCN reaction causing severe rash involving mucus membranes or skin necrosis: No Has patient had a PCN reaction that required hospitalization: No Has patient had a PCN reaction occurring within the last 10 years: Yes If all of the above answers are "NO", then may proceed with Cephalosporin use.            Current Outpatient Medications  Medication Sig Dispense Refill   acetaminophen (TYLENOL) 500 MG tablet Take 1 tablet  (500 mg total) by mouth every 8 (eight) hours for 10 days. 30 tablet 0   albuterol (PROVENTIL HFA;VENTOLIN HFA) 108 (90 BASE) MCG/ACT inhaler Inhale 1-2 puffs into the lungs daily as needed for wheezing or shortness of breath.        aspirin EC 325 MG tablet Take 1 tablet (325 mg total) by  mouth daily. 15 tablet 0   fluticasone (FLONASE) 50 MCG/ACT nasal spray Place 1 spray into both nostrils daily as needed for allergies.        HYDROcodone-acetaminophen (NORCO/VICODIN) 5-325 MG tablet Take 1 tablet by mouth every 4 (four) hours as needed (Chronic Pain). 30 tablet 0   ibuprofen (ADVIL) 800 MG tablet Take 1 tablet (800 mg total) by mouth every 8 (eight) hours for 10 days. Please take with food, please alternate with acetaminophen 30 tablet 0   oxyCODONE (OXY IR/ROXICODONE) 5 MG immediate release tablet Take 1 tablet (5 mg total) by mouth every 4 (four) hours as needed (severe pain). 20 tablet 0   rosuvastatin (CRESTOR) 5 MG tablet Take 5 mg by mouth at bedtime.       tiZANidine (ZANAFLEX) 2 MG tablet Take 1 tablet (2 mg total) by mouth every 8 (eight) hours as needed (Neck pain). 30 tablet 0   traZODone (DESYREL) 50 MG tablet Take 50-100 mg by mouth at bedtime.       Upadacitinib ER (RINVOQ) 15 MG TB24 Take 15 mg by mouth daily.          No current facility-administered medications for this visit.      Imaging Results (Last 48 hours)  No results found.     Review of Systems:   A ROS was performed including pertinent positives and negatives as documented in the HPI.   Physical Exam :   Constitutional: NAD and appears stated age Neurological: Alert and oriented Psych: Appropriate affect and cooperative Last menstrual period 04/19/2018.    Comprehensive Musculoskeletal Exam:     Tenderness to palpation about the greater trochanter with weakness with resisted abduction.  There is radiation into the anterior lateral thigh.  Negative FADIR test.  Range of motion is 30 degrees  internal/external rotation of the right hip with positive Trendelenburg on the right   Imaging:   Xray (3 views right hip): Normal   MRI (right hip): There is a gluteus medius tear involving the insertional junction between the minimus and medius   I personally reviewed and interpreted the radiographs.     Assessment:   52 y.o. 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  At this time she has had countless number of injections over the trochanteric which are no longer providing relief.  At this time given the fact that she has trialed this with physical therapy without relief we did discuss the possibility of gluteus medius repair.  Specifically given the number of injections she has had in the past I do believe she would benefit from patch augmentation given the quality of the likely tissue.  We did discuss the rehab process associated with this.  Did discuss limitations and restrictions associated with this.  After long discussion she has elected for right hip gluteus medius repair with collagen patch augmentation Plan :     -Plan for right hip gluteus medius repair with collagen patch augmentation     After a lengthy discussion of treatment options, including risks, benefits, alternatives, complications of surgical and nonsurgical conservative options, the patient elected surgical repair.    The patient  is aware of the material risks  and complications including, but not limited to injury to adjacent structures, neurovascular injury, infection, numbness, bleeding, implant failure, thermal burns, stiffness, persistent pain, failure to heal, disease transmission from allograft, need for further surgery, dislocation, anesthetic risks, blood clots, risks of death,and others. The probabilities of  surgical success and failure discussed with patient given their particular co-morbidities.The time and nature of expected rehabilitation and recovery was  discussed.The patient's questions were all answered preoperatively.  No barriers to understanding were noted. I explained the natural history of the disease process and Rx rationale.  I explained to the patient what I considered to be reasonable expectations given their personal situation.  The final treatment plan was arrived at through a shared patient decision making process model.           I personally saw and evaluated the patient, and participated in the management and treatment plan.   Huel Cote, MD Attending Physician, Orthopedic Surgery

## 2023-05-10 NOTE — Discharge Instructions (Addendum)
Discharge Instructions    Attending Surgeon: Huel Cote, MD Office Phone Number: [phone]   Diagnosis and Procedures:    Surgeries Performed: Right hip gluteus medius repair  Discharge Plan:    Diet: Resume usual diet. Begin with light or bland foods.  Drink plenty of fluids.  Activity:  Touchdown weight bearing right leg. You are advised to go home directly from the hospital or surgical center. Restrict your activities.  GENERAL INSTRUCTIONS: 1.  Please apply ice to your wound to help with swelling and inflammation. This will improve your comfort and your overall recovery following surgery.     2. Please call Dr. Serena Croissant office at [phone] with questions Monday-Friday during business hours. If no one answers, please leave a message and someone should get back to the patient within 24 hours. For emergencies please call 911 or proceed to the emergency room.   3. Patient to notify surgical team if experiences any of the following: Bowel/Bladder dysfunction, uncontrolled pain, nerve/muscle weakness, incision with increased drainage or redness, nausea/vomiting and Fever greater than 101.0 F.  Be alert for signs of infection including redness, streaking, odor, fever or chills. Be alert for excessive pain or bleeding and notify your surgeon immediately.  WOUND INSTRUCTIONS:   Leave your dressing, cast, or splint in place until your post operative visit.  Keep it clean and dry.  Always keep the incision clean and dry until the staples/sutures are removed. If there is no drainage from the incision you should keep it open to air. If there is drainage from the incision you must keep it covered at all times until the drainage stops  Do not soak in a bath tub, hot tub, pool, lake or other body of water until 21 days after your surgery and your incision is completely dry and healed.  If you have removable sutures (or staples) they must be removed 10-14 days (unless  otherwise instructed) from the day of your surgery.     1)  Elevate the extremity as much as possible.  2)  Keep the dressing clean and dry.  3)  Please call us if the dressing becomes wet or dirty.  4)  If you are experiencing worsening pain or worsening swelling, please call.     MEDICATIONS: Resume all previous home medications at the previous prescribed dose and frequency unless otherwise noted Start taking the  pain medications on an as-needed basis as prescribed  Please taper down pain medication over the next week following surgery.  Ideally you should not require a refill of any narcotic pain medication.  Take pain medication with food to minimize nausea. In addition to the prescribed pain medication, you may take over-the-counter pain relievers such as Tylenol.  Do NOT take additional tylenol if your pain medication already has tylenol in it.  Aspirin 325mg  daily for four weeks.      FOLLOWUP INSTRUCTIONS: 1. Follow up at the Physical Therapy Clinic 3-4 days following surgery. This appointment should be scheduled unless other arrangements have been made.The Physical Therapy scheduling number is [phone] if an appointment has not already been arranged.  2. Contact Dr. Serena Croissant office during office hours at [phone] or the practice after hours line at [phone] for non-emergencies. For medical emergencies call 911.   Discharge Location: Home  No Tylenol until after 4:40pm today if needed  Post Anesthesia Home Care Instructions  Activity: Get plenty of rest for the remainder of the day. A responsible individual must stay  with you for 24 hours following the procedure.  For the next 24 hours, DO NOT: -Drive a car -Advertising copywriter -Drink alcoholic beverages -Take any medication unless instructed by your physician -Make any legal decisions or sign important papers.  Meals: Start with liquid foods such as gelatin or soup. Progress to regular foods as  tolerated. Avoid greasy, spicy, heavy foods. If nausea and/or vomiting occur, drink only clear liquids until the nausea and/or vomiting subsides. Call your physician if vomiting continues.  Special Instructions/Symptoms: Your throat may feel dry or sore from the anesthesia or the breathing tube placed in your throat during surgery. If this causes discomfort, gargle with warm salt water. The discomfort should disappear within 24 hours.  If you had a scopolamine patch placed behind your ear for the management of post- operative nausea and/or vomiting:  1. The medication in the patch is effective for 72 hours, after which it should be removed.  Wrap patch in a tissue and discard in the trash. Wash hands thoroughly with soap and water. 2. You may remove the patch earlier than 72 hours if you experience unpleasant side effects which may include dry mouth, dizziness or visual disturbances. 3. Avoid touching the patch. Wash your hands with soap and water after contact with the patch.    Post Anesthesia Home Care Instructions  Activity: Get plenty of rest for the remainder of the day. A responsible individual must stay with you for 24 hours following the procedure.  For the next 24 hours, DO NOT: -Drive a car -Advertising copywriter -Drink alcoholic beverages -Take any medication unless instructed by your physician -Make any legal decisions or sign important papers.  Meals: Start with liquid foods such as gelatin or soup. Progress to regular foods as tolerated. Avoid greasy, spicy, heavy foods. If nausea and/or vomiting occur, drink only clear liquids until the nausea and/or vomiting subsides. Call your physician if vomiting continues.  Special Instructions/Symptoms: Your throat may feel dry or sore from the anesthesia or the breathing tube placed in your throat during surgery. If this causes discomfort, gargle with warm salt water. The discomfort should disappear within 24 hours.  If you had a  scopolamine patch placed behind your ear for the management of post- operative nausea and/or vomiting:  1. The medication in the patch is effective for 72 hours, after which it should be removed.  Wrap patch in a tissue and discard in the trash. Wash hands thoroughly with soap and water. 2. You may remove the patch earlier than 72 hours if you experience unpleasant side effects which may include dry mouth, dizziness or visual disturbances. 3. Avoid touching the patch. Wash your hands with soap and water after contact with the patch.     Last dose of tylenol given at 1045am

## 2023-05-10 NOTE — Brief Op Note (Signed)
   Brief Op Note  Date of Surgery: 05/10/2023  Preoperative Diagnosis: RIGHT GLUTEUS MEDIUS TEAR  Postoperative Diagnosis: same  Procedure: Procedure(s): RIGHT GLUTEUS MEDIUS REPAIR WITH COLLAGEN PATCH AUGMENTATION  Implants: Implant Name Type Inv. Item Serial No. Manufacturer Lot No. LRB No. Used Action  ANCHOR JUGGERKNOT SOFT 1.5 - [id number] Anchor ANCHOR JUGGERKNOT SOFT 1.5  [employer]IMMER [employer]ON(ORTH,TRAU,BIO,SG) [id number] Right 2 Implanted  [facility] SUT KNTLS STRL SHLDR SYS - [id number] Anchor ANCH SUT KNTLS STRL SHLDR SYS  ZIMMER [employer]ON(ORTH,TRAU,BIO,SG) [id number] Right 1 Implanted  ANCH SUT KNTLS STRL SHLDR SYS - [id number] Anchor ANCH SUT KNTLS STRL SHLDR SYS  [employer]ON(ORTH,TRAU,BIO,SG) [id number] Right 1 Implanted  IMPL TAPESTRY BIOINTEGR 40X30 - [id number] Mesh General IMPL TAPESTRY BIOINTEGR 40X30  ZIMMER [employer]ON(ORTH,TRAU,BIO,SG) [id number] Right 1 Implanted    Surgeons: Surgeon(s): Huel Cote, MD  Anesthesia: General    Estimated Blood Loss: See anesthesia record  Complications: None  Condition to PACU: Stable  Benancio Deeds, MD 05/10/2023 12:05 PM

## 2023-05-10 NOTE — Op Note (Addendum)
Date of Surgery: 05/10/2023  INDICATIONS: Angela Sims is a 52 y.o.-year-old female with right hip gluteus medius full thickness tear.  The risk and benefits of the procedure were discussed in detail and documented in the pre-operative evaluation.   PREOPERATIVE DIAGNOSIS: 1. Right hip gluteus medius tear  POSTOPERATIVE DIAGNOSIS: Same.  PROCEDURE: 1. Right hip gluteus medius repair 2. Right hip trochanteric bursectomy  SURGEON: Benancio Deeds MD  ASSISTANT: Kerby Less, ATC  ANESTHESIA:  general  IV FLUIDS AND URINE: See anesthesia record.  ANTIBIOTICS: Ancef  ESTIMATED BLOOD LOSS: 10 mL.  IMPLANTS:  Implant Name Type Inv. Item Serial No. Manufacturer Lot No. LRB No. Used Action  ANCHOR JUGGERKNOT SOFT 1.5 - [id number] Anchor ANCHOR JUGGERKNOT SOFT 1.5  [employer]ON(ORTH,TRAU,BIO,SG) [id number] Right 2 Implanted  [facility] SUT KNTLS STRL SHLDR SYS - [id number] Anchor ANCH SUT KNTLS STRL SHLDR SYS  ZIMMER [employer]ON(ORTH,TRAU,BIO,SG) [id number] Right 1 Implanted  ANCH SUT KNTLS STRL SHLDR SYS - [id number] Anchor ANCH SUT KNTLS STRL SHLDR SYS  [employer]MER [employer]ON(ORTH,TRAU,BIO,SG) [id number] Right 1 Implanted  IMPL TAPESTRY BIOINTEGR 40X30 - [id number] Mesh General IMPL TAPESTRY BIOINTEGR 40X30  ZIMMER [employer]ON(ORTH,TRAU,BIO,SG) [id number] Right 1 Implanted    DRAINS: None  CULTURES: None  COMPLICATIONS: none  DESCRIPTION OF PROCEDURE:  The patient was identified in the preoperative holding area.  The correct site was marked and confirmed according to nursing.  The patient was subsequently taken back to the operating room.  Antibiotics were given 1 hour prior to incision.  Anesthesia was induced.  He was placed in the lateral position with a beanbag positioner with care to pad the down leg and peroneal nerve.   Patient was prepped and draped in the usual sterile fashion.  Again timeout was performed confirming the correct side.  An approach was made over the lateral aspect of the greater trochanter.   Dissection was initially carried down sharply with 15 blade and electrocautery was used to perform hemostasis.  A 15 blade was used to make a nick in the IT band which was completed with Mayo scissors.  At this point we encountered the gluteus medius tendon. There was overlying bursa was dissected out sharply with Metzenbaum scissors and removed. This was extremely thin and then completely torn.  This was debrided and the trochanteric lateral posterior facet was prepared using a Cobb.  We then mobilized the gluteus medius abductor muscles with an Allis clamp.  Excess bursal tissue was excised sharply with Mayo scissors.  A double row type configuration was then utilized with 2 Medial Row all suture anchors (a total of 8 limbs).  These were placed through the tissue and then brought to 2 Lateral Row Quattro anchors.  This produced an anatomic footprint restoration of the gluteus tendon.  Given the quality of the tissue at this point a collagen patch augmentation was utilized over the lead insertion.  This was sutured into place with 0 Vicryl in the corners of the graft   The wounds were thoroughly irrigated.  We closed in layers of 0 Vicryl for the IT band, 2-0 Vicryl, and staples for skin. The patient was awoken and taken to the PACU.  All counts were correct at the end of the case and no complications.       POSTOPERATIVE PLAN: She will be touchdown weightbearing for 2 weeks.  She will place on aspirin for blood clot prevention.  She will be seen by PT postop week 1.   Benancio Deeds, MD 12:05 PM

## 2023-05-10 NOTE — Anesthesia Procedure Notes (Signed)
Procedure Name: Intubation Date/Time: 05/10/2023 11:24 AM  Performed by: Karen Kitchens, CRNAPre-anesthesia Checklist: Patient identified, Emergency Drugs available, Suction available and Patient being monitored Patient Re-evaluated:Patient Re-evaluated prior to induction Oxygen Delivery Method: Circle system utilized Preoxygenation: Pre-oxygenation with 100% oxygen Induction Type: IV induction Ventilation: Mask ventilation without difficulty Laryngoscope Size: Mac and 4 Grade View: Grade I Tube type: Oral Tube size: 7.0 mm Number of attempts: 1 Airway Equipment and Method: Stylet and Oral airway Placement Confirmation: ETT inserted through vocal cords under direct vision, positive ETCO2, breath sounds checked- equal and bilateral and CO2 detector Secured at: 22 cm Tube secured with: Tape Dental Injury: Teeth and Oropharynx as per pre-operative assessment

## 2023-05-10 NOTE — Anesthesia Postprocedure Evaluation (Signed)
Anesthesia Post Note  Patient: Angela Sims  Procedure(s) Performed: RIGHT GLUTEUS MEDIUS REPAIR WITH COLLAGEN PATCH AUGMENTATION (Right: Hip)     Patient location during evaluation: PACU Anesthesia Type: General Level of consciousness: awake and alert and oriented Pain management: pain level controlled Vital Signs Assessment: post-procedure vital signs reviewed and stable Respiratory status: spontaneous breathing, nonlabored ventilation and respiratory function stable Cardiovascular status: blood pressure returned to baseline and stable Postop Assessment: no apparent nausea or vomiting Anesthetic complications: no   No notable events documented.  Last Vitals:  Vitals:   05/10/23 1230 05/10/23 1245  BP: 98/83 124/78  Pulse: 66 69  Resp: 10 17  Temp:    SpO2: 100% 97%    Last Pain:  Vitals:   05/10/23 1258  PainSc: 6                  Brookie Wayment A.

## 2023-05-10 NOTE — Anesthesia Preprocedure Evaluation (Addendum)
Anesthesia Evaluation  Patient identified by MRN, date of birth, ID band Patient awake    Reviewed: Allergy & Precautions, H&P , NPO status , Patient's Chart, lab work & pertinent test results, Unable to perform ROS - Chart review only  History of Anesthesia Complications (+) PONV and history of anesthetic complications  Airway Mallampati: II  TM Distance: >3 FB Neck ROM: Full    Dental no notable dental hx. (+) Dental Advisory Given, Teeth Intact   Pulmonary neg pulmonary ROS   Pulmonary exam normal breath sounds clear to auscultation       Cardiovascular Exercise Tolerance: Good negative cardio ROS Normal cardiovascular exam Rhythm:Regular Rate:Normal     Neuro/Psych  Neuromuscular disease negative neurological ROS  negative psych ROS   GI/Hepatic negative GI ROS, Neg liver ROS,,,  Endo/Other  negative endocrine ROS    Renal/GU negative Renal ROS  negative genitourinary   Musculoskeletal negative musculoskeletal ROS (+) Arthritis ,  Fibromyalgia -  Abdominal   Peds negative pediatric ROS (+)  Hematology negative hematology ROS (+) 13/41   Anesthesia Other Findings   Reproductive/Obstetrics negative OB ROS                             Anesthesia Physical Anesthesia Plan  ASA: 3  Anesthesia Plan: General   Post-op Pain Management: Tylenol PO (pre-op)* and Celebrex PO (pre-op)*   Induction: Intravenous  PONV Risk Score and Plan: 4 or greater and Ondansetron, Dexamethasone, Midazolam, Scopolamine patch - Pre-op, Treatment may vary due to age or medical condition and TIVA  Airway Management Planned: Oral ETT and LMA  Additional Equipment: None  Intra-op Plan:   Post-operative Plan: Extubation in OR  Informed Consent: I have reviewed the patients History and Physical, chart, labs and discussed the procedure including the risks, benefits and alternatives for the proposed  anesthesia with the patient or authorized representative who has indicated his/her understanding and acceptance.     Dental advisory given  Plan Discussed with: CRNA and Anesthesiologist  Anesthesia Plan Comments:         Anesthesia Quick Evaluation

## 2023-05-11 ENCOUNTER — Other Ambulatory Visit (HOSPITAL_BASED_OUTPATIENT_CLINIC_OR_DEPARTMENT_OTHER): Payer: Self-pay | Admitting: Orthopaedic Surgery

## 2023-05-11 ENCOUNTER — Encounter (HOSPITAL_BASED_OUTPATIENT_CLINIC_OR_DEPARTMENT_OTHER): Payer: Self-pay | Admitting: Orthopaedic Surgery

## 2023-05-11 MED ORDER — IBUPROFEN 800 MG PO TABS: 800.0000 mg | ORAL_TABLET | Freq: Three times a day (TID) | ORAL | 0 refills | Status: AC

## 2023-05-11 MED ORDER — SCOPOLAMINE 1 MG/3DAYS TD PT72: 1.0000 | MEDICATED_PATCH | TRANSDERMAL | 12 refills | Status: AC

## 2023-05-12 ENCOUNTER — Other Ambulatory Visit (HOSPITAL_BASED_OUTPATIENT_CLINIC_OR_DEPARTMENT_OTHER): Payer: Self-pay | Admitting: Orthopaedic Surgery

## 2023-05-12 MED ORDER — HYDROCODONE-ACETAMINOPHEN 5-325 MG PO TABS: 1.0000 | ORAL_TABLET | Freq: Four times a day (QID) | ORAL | 0 refills | Status: DC | PRN

## 2023-05-16 ENCOUNTER — Encounter (HOSPITAL_BASED_OUTPATIENT_CLINIC_OR_DEPARTMENT_OTHER): Payer: Self-pay | Admitting: Orthopaedic Surgery

## 2023-05-17 ENCOUNTER — Ambulatory Visit (HOSPITAL_BASED_OUTPATIENT_CLINIC_OR_DEPARTMENT_OTHER): Payer: Medicare Other | Attending: Orthopaedic Surgery | Admitting: Physical Therapy

## 2023-05-17 ENCOUNTER — Other Ambulatory Visit: Payer: Self-pay

## 2023-05-17 ENCOUNTER — Encounter (HOSPITAL_BASED_OUTPATIENT_CLINIC_OR_DEPARTMENT_OTHER): Payer: Self-pay | Admitting: Physical Therapy

## 2023-05-17 DIAGNOSIS — R262 Difficulty in walking, not elsewhere classified: Secondary | ICD-10-CM | POA: Insufficient documentation

## 2023-05-17 DIAGNOSIS — M67959 Unspecified disorder of synovium and tendon, unspecified thigh: Secondary | ICD-10-CM | POA: Diagnosis present

## 2023-05-17 DIAGNOSIS — M25551 Pain in right hip: Secondary | ICD-10-CM | POA: Insufficient documentation

## 2023-05-17 DIAGNOSIS — M6281 Muscle weakness (generalized): Secondary | ICD-10-CM | POA: Insufficient documentation

## 2023-05-17 NOTE — Therapy (Signed)
OUTPATIENT PHYSICAL THERAPY EVALUATION   Patient Name: Angela Sims MRN: [id number] DOB:Mar 05, 1971, 52 y.o., female Today's Date: 05/17/2023  END OF SESSION:  PT End of Session - 05/17/23 1021     Visit Number 1    Number of Visits 13    Date for PT Re-Evaluation 08/13/23    PT Start Time 1018    PT Stop Time 1058    PT Time Calculation (min) 40 min    Activity Tolerance Patient tolerated treatment well    Behavior During Therapy [facility] for tasks assessed/performed             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   High cholesterol    Myofascial pain syndrome    PONV (postoperative nausea and vomiting) 12/05/2015   Pseudoarthrosis of lumbar spine    Restless leg syndrome    Seasonal allergies    Past Surgical History:  Procedure Laterality Date   ABDOMINAL EXPOSURE N/A 12/05/2015   Procedure: ABDOMINAL EXPOSURE;  Surgeon: Larina Earthly, MD;  Location: [facility];  Service: Vascular;  Laterality: N/A;   ANTERIOR CERVICAL DECOMP/DISCECTOMY FUSION N/A 09/29/2022   Procedure: Anterior Cervical Discectomy Fusion Cervical Four-Cervical Five;  Surgeon: Tia Alert, MD;  Location: [facility] OR;  Service: Neurosurgery;  Laterality: N/A;  3C   ANTERIOR LUMBAR FUSION N/A 12/05/2015   Procedure: ANTERIOR LUMBAR FUSION LUMBAR FIVE-SACRAL ONE ;  Surgeon: Tia Alert, MD;  Location: [facility];  Service: Neurosurgery;  Laterality: N/A;  Abdominal approach   CERVICAL DISC ARTHROPLASTY N/A 01/22/2016   Procedure: C5-6 Cervical artificial disc replacment;  Surgeon: Tia Alert, MD;  Location: [facility];  Service: Neurosurgery;  Laterality: N/A;  C5-6 Cervical artificial disc replacment   CHOLECYSTECTOMY  08/09/2022   GLUTEUS MINIMUS REPAIR Right 05/10/2023   Procedure: RIGHT GLUTEUS MEDIUS REPAIR WITH COLLAGEN PATCH AUGMENTATION;   Surgeon: Huel Cote, MD;  Location: [facility];  Service: Orthopedics;  Laterality: Right;   LAMINECTOMY WITH POSTERIOR LATERAL ARTHRODESIS LEVEL 1 N/A 05/12/2018   Procedure: Posterior lateral fusion - Lumbar five-Sacral one with non-segmental fixation;  Surgeon: Tia Alert, MD;  Location: [facility];  Service: Neurosurgery;  Laterality: N/A;   LUMBAR FUSION  12/05/2015   LUMBAR LAMINECTOMY/DECOMPRESSION MICRODISCECTOMY Right 06/25/2015   Procedure: LUMBAR LAMINECTOMY/DECOMPRESSION MICRODISCECTOMY 1 LEVEL; RIGHT ;  Surgeon: Tia Alert, MD;  Location: [facility];  Service: Neurosurgery;  Laterality: Right;   WISDOM TOOTH EXTRACTION     Patient Active Problem List   Diagnosis Date Noted   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/2023   S/P cervical spinal fusion 09/29/2022   Failed back syndrome 10/05/2017   Neck pain 10/05/2017   Cervical spondylosis without myelopathy 09/30/2016   Radiculopathy of lumbar region 01/22/2016   Cervical radiculopathy 12/22/2015   S/P lumbar spinal fusion 12/05/2015   Other intervertebral disc degeneration, lumbosacral region 10/14/2015   S/P lumbar laminectomy 06/25/2015     REFERRING PROVIDER:  Huel Cote, MD   REFERRING DIAG:  [id number] (ICD-10-CM) - Tendinopathy of gluteus medius    post op  R glute med repair  Rationale for Evaluation and Treatment: Rehabilitation  THERAPY DIAG:  Pain in right hip  Muscle weakness (generalized)  Difficulty in walking, not elsewhere classified  ONSET DATE: DOS  9/3   SUBJECTIVE:                                                                                                                                                                                           SUBJECTIVE STATEMENT: Feeling pretty good. History of injections and treatments as she thought it was back problems. I am very clumsy.   PERTINENT HISTORY:  L5/S1 fusion, cervical surgery x3  PAIN:  Are you having  pain? No  PRECAUTIONS:  None  RED FLAGS: None   WEIGHT BEARING RESTRICTIONS:  Yes POSTOPERATIVE PLAN: She will be touchdown weightbearing for 2 weeks  FALLS:  Has patient fallen in last 6 months? No  LIVING ENVIRONMENT: 3 steps to enter, then 2 and both have a railing  OCCUPATION:  Not working  PLOF:  Independent  PATIENT GOALS:  Return to walking for exercise, stairs, hiking  OBJECTIVE:   PATIENT SURVEYS:  FOTO 32  COGNITIVE STATUS: Within functional limits for tasks assessed   SENSATION: WFL  POSTURE:  Eval: hip flexion in standing to be on toe   GAIT: Eval: TDWB with 2 wheeled walker   Body Part #1 Hip  PALPATION: Eval: guarded end feel, no warmth or redness around bandage  LOWER EXTREMITY ROM:     Passive  Right eval   Hip flexion 90   Hip extension    Hip abduction    Hip adduction    Hip internal rotation    Hip external rotation    Knee flexion    Knee extension    Ankle dorsiflexion    Ankle plantarflexion    Ankle inversion    Ankle eversion     (Blank rows = not tested)      TREATMENT:                                                                                                                               DATE: EVAL 9/10 Aquacel present- bandage not changed See HEP Gait training with RW- touch down with heel strike      PATIENT  EDUCATION:  Education details: Teacher, music of condition, POC, HEP, exercise form/rationale Person educated: Patient and Spouse Education method: Explanation, Demonstration, Tactile cues, Verbal cues, and Handouts Education comprehension: verbalized understanding, returned demonstration, verbal cues required, tactile cues required, and needs further education  HOME EXERCISE PROGRAM: [id number]   ASSESSMENT:  CLINICAL IMPRESSION: Patient is a 52 y.o. F who was seen today for physical therapy evaluation and treatment for s/p Rt glut med repair. She does live about an hour away and wants to come  here for the pool. She is doing  very well and tolerated exercises without pain today. Will continue at 1/week frequency on land until incision heals so she is able to go into the pool.     REHAB POTENTIAL: Good  CLINICAL DECISION MAKING: Stable/uncomplicated  EVALUATION COMPLEXITY: Low   GOALS: Goals reviewed with patient? Yes  SHORT TERM GOALS: Target date: 10/5  Incision healing well for progression to aquatics Baseline: Goal status: INITIAL  2.  Ambulate household distances without AD, pain <2/10 Baseline:  Goal status: INITIAL    LONG TERM GOALS: Target date: POC date   Begin progression of walking for exercise program Baseline:  Goal status: INITIAL  2.  Good activation of glut med against gravity in sidelying Baseline:  Goal status: INITIAL  3.  Meet FOTO goal Baseline:  Goal status: INITIAL  4.  Independent in long term HEP Baseline:  Goal status: INITIAL   PLAN:  PT FREQUENCY: 1-2x/week  PT DURATION: 12 weeks  PLANNED INTERVENTIONS: Therapeutic exercises, Therapeutic activity, Neuromuscular re-education, Balance training, Gait training, Patient/Family education, Self Care, Joint mobilization, Stair training, Aquatic Therapy, Dry Needling, Spinal mobilization, Cryotherapy, Moist heat, Taping, Manual therapy, and Re-evaluation.  PLAN FOR NEXT SESSION: continue per protocol   Chauntel Windsor C. Tou Hayner PT, DPT 05/17/23 12:45 PM

## 2023-05-18 ENCOUNTER — Encounter (HOSPITAL_BASED_OUTPATIENT_CLINIC_OR_DEPARTMENT_OTHER): Payer: Self-pay | Admitting: Orthopaedic Surgery

## 2023-05-22 ENCOUNTER — Encounter (HOSPITAL_BASED_OUTPATIENT_CLINIC_OR_DEPARTMENT_OTHER): Payer: Self-pay | Admitting: Physical Therapy

## 2023-05-23 ENCOUNTER — Ambulatory Visit (INDEPENDENT_AMBULATORY_CARE_PROVIDER_SITE_OTHER): Payer: Medicare Other | Admitting: Orthopaedic Surgery

## 2023-05-23 ENCOUNTER — Encounter (HOSPITAL_BASED_OUTPATIENT_CLINIC_OR_DEPARTMENT_OTHER): Payer: Self-pay

## 2023-05-23 ENCOUNTER — Ambulatory Visit (HOSPITAL_BASED_OUTPATIENT_CLINIC_OR_DEPARTMENT_OTHER): Payer: Medicare Other | Admitting: Physical Therapy

## 2023-05-23 DIAGNOSIS — M67959 Unspecified disorder of synovium and tendon, unspecified thigh: Secondary | ICD-10-CM

## 2023-05-23 NOTE — Progress Notes (Signed)
Post Operative Evaluation    Procedure/Date of Surgery: Right gluteus medius tendon repair 9/3  Interval History:    Presents today 2 weeks status post the above procedure.  She is doing very well.  She has been working with physical therapy.  Has been compliant with weightbearing restrictions   PMH/PSH/Family History/Social History/Meds/Allergies:    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   High cholesterol    Myofascial pain syndrome    PONV (postoperative nausea and vomiting) 12/05/2015   Pseudoarthrosis of lumbar spine    Restless leg syndrome    Seasonal allergies    Past Surgical History:  Procedure Laterality Date   ABDOMINAL EXPOSURE N/A 12/05/2015   Procedure: ABDOMINAL EXPOSURE;  Surgeon: Larina Earthly, MD;  Location: [facility];  Service: Vascular;  Laterality: N/A;   ANTERIOR CERVICAL DECOMP/DISCECTOMY FUSION N/A 09/29/2022   Procedure: Anterior Cervical Discectomy Fusion Cervical Four-Cervical Five;  Surgeon: Tia Alert, MD;  Location: [facility] OR;  Service: Neurosurgery;  Laterality: N/A;  3C   ANTERIOR LUMBAR FUSION N/A 12/05/2015   Procedure: ANTERIOR LUMBAR FUSION LUMBAR FIVE-SACRAL ONE ;  Surgeon: Tia Alert, MD;  Location: [facility];  Service: Neurosurgery;  Laterality: N/A;  Abdominal approach   CERVICAL DISC ARTHROPLASTY N/A 01/22/2016   Procedure: C5-6 Cervical artificial disc replacment;  Surgeon: Tia Alert, MD;  Location: [facility];  Service: Neurosurgery;  Laterality: N/A;  C5-6 Cervical artificial disc replacment   CHOLECYSTECTOMY  08/09/2022   GLUTEUS MINIMUS REPAIR Right 05/10/2023   Procedure: RIGHT GLUTEUS MEDIUS REPAIR WITH COLLAGEN PATCH AUGMENTATION;  Surgeon: Huel Cote, MD;  Location: [facility];  Service: Orthopedics;  Laterality:  Right;   LAMINECTOMY WITH POSTERIOR LATERAL ARTHRODESIS LEVEL 1 N/A 05/12/2018   Procedure: Posterior lateral fusion - Lumbar five-Sacral one with non-segmental fixation;  Surgeon: Tia Alert, MD;  Location: [facility] OR;  Service: Neurosurgery;  Laterality: N/A;   LUMBAR FUSION  12/05/2015   LUMBAR LAMINECTOMY/DECOMPRESSION MICRODISCECTOMY Right 06/25/2015   Procedure: LUMBAR LAMINECTOMY/DECOMPRESSION MICRODISCECTOMY 1 LEVEL; RIGHT ;  Surgeon: Tia Alert, MD;  Location: [facility];  Service: Neurosurgery;  Laterality: Right;   WISDOM TOOTH EXTRACTION     Social History   Socioeconomic History   Marital status: Married    Spouse name: Not on file   Number of children: 1   Years of education: Not on file   Highest education level: Not on file  Occupational History   Not on file  Tobacco Use   Smoking status: Never   Smokeless tobacco: Never  Vaping Use   Vaping status: Never Used  Substance and Sexual Activity   Alcohol use: Not Currently   Drug use: No   Sexual activity: Not on file  Other Topics Concern   Not on file  Social History Narrative   Not on file   Social Determinants of Health   Financial Resource Strain: Low Risk  (10/26/2022)   Received from [facility], [facility]   Overall Financial Resource Strain (CARDIA)    Difficulty of Paying Living Expenses: Not hard at all  Food Insecurity: No Food Insecurity (10/26/2022)   Received [facility], [facility]   Hunger Vital Sign    Worried About Running Out of Food in the Last Year: Never true    Ran Out of Food in the Last Year: Never true  Transportation Needs: No Transportation Needs (10/26/2022)   Received from [facility], [facility]   [facility] - Transportation    Lack of Transportation (Medical): No    Lack of Transportation (Non-Medical): No  Physical Activity: Inactive (10/26/2022)   Received from [facility], [facility]   Exercise Vital Sign    Days of Exercise per Week: 0 days     Minutes of Exercise per Session: 30 min  Stress: No Stress Concern Present (10/26/2022)   Received from [facility] - Occupational Stress Questionnaire    Feeling of Stress : Not at all  Social Connections: Socially Integrated (07/09/2022)   Received from [facility], [facility]   Social Network    How would you rate your social network (family, work, friends)?: Good participation with social networks   Family History  Problem Relation Age of Onset   Bladder Cancer Mother    Alcohol abuse Father    Allergies  Allergen Reactions   Clavulanic Acid Other (See Comments)    Unknown   Cyclobenzaprine Other (See Comments)    Pt stated, "I got restless leg syndrome with this medication"   Lactose Intolerance (Gi) Other (See Comments)    Gassy / Cramps   Gabapentin Other (See Comments)    moody   Penicillins Rash    Has patient had a PCN reaction causing immediate rash, facial/tongue/throat swelling, SOB or lightheadedness with hypotension: Yes Has patient had a PCN reaction causing severe rash involving mucus membranes or skin necrosis: No Has patient had a PCN reaction that required hospitalization: No Has patient had a PCN reaction occurring within the last 10 years: Yes If all of the above answers are "NO", then may proceed with Cephalosporin use.   Current Outpatient Medications  Medication Sig Dispense Refill   albuterol (PROVENTIL HFA;VENTOLIN HFA) 108 (90 BASE) MCG/ACT inhaler Inhale 1-2 puffs into the lungs daily as needed for wheezing or shortness of breath.      aspirin EC 325 MG tablet Take 1 tablet (325 mg total) by mouth daily. 30 tablet 0   cephALEXin (KEFLEX) 500 MG capsule Take 500 mg by mouth 4 (four) times daily.     [employer] FIBER GUMMIES PO Take by mouth.     fluticasone (FLONASE) 50 MCG/ACT nasal spray Place 1 spray into both nostrils daily as needed for allergies.      HYDROcodone-acetaminophen  (NORCO/VICODIN) 5-325 MG tablet Take 1 tablet by mouth every 6 (six) hours as needed for moderate pain. 25 tablet 0   oxycodone (OXY-IR) 5 MG capsule Take 1 capsule (5 mg total) by mouth every 4 (four) hours as needed (severe pain). 20 capsule 0   rosuvastatin (CRESTOR) 5 MG tablet Take 5 mg by mouth at bedtime.     scopolamine (TRANSDERM-SCOP) 1 MG/3DAYS Place 1 patch (1.5 mg total) onto the skin every 3 (three) days. 10 patch 12   tiZANidine (ZANAFLEX) 2 MG tablet Take 1 tablet (2 mg total) by mouth every 8 (eight) hours as needed (Neck pain). 30 tablet 0   traZODone (DESYREL) 50 MG tablet Take 50-100 mg by mouth at bedtime.     Upadacitinib ER (RINVOQ) 15 MG  TB24 Take 15 mg by mouth daily.     No current facility-administered medications for this visit.   No results found.  Review of Systems:   A ROS was performed including pertinent positives and negatives as documented in the HPI.   Musculoskeletal Exam:    Right hip incision is well-appearing without erythema or drainage.  30 degrees internal/external rotation about the right hip.  Able to flex to 90 degrees.  Distal neurosensory exam is intact with  Imaging:      I personally reviewed and interpreted the radiographs.   Assessment:   2 weeks status post right hip gluteus medius repair overall doing extremely well.  At this time she will advance her weight to full weightbearing.  I will plan to see her back in 4 weeks for reassessment  Plan :    -Return to clinic 4 weeks for reassessment      I personally saw and evaluated the patient, and participated in the management and treatment plan.  Huel Cote, MD Attending Physician, Orthopedic Surgery  This document was dictated using Dragon voice recognition software. A reasonable attempt at proof reading has been made to minimize errors.

## 2023-05-26 ENCOUNTER — Encounter (HOSPITAL_BASED_OUTPATIENT_CLINIC_OR_DEPARTMENT_OTHER): Payer: Self-pay | Admitting: Orthopaedic Surgery

## 2023-05-26 ENCOUNTER — Other Ambulatory Visit: Payer: Self-pay | Admitting: Orthopaedic Surgery

## 2023-05-26 MED ORDER — HYDROCODONE-ACETAMINOPHEN 5-325 MG PO TABS: 1.0000 | ORAL_TABLET | Freq: Four times a day (QID) | ORAL | 0 refills | Status: AC | PRN

## 2023-05-30 ENCOUNTER — Encounter (HOSPITAL_BASED_OUTPATIENT_CLINIC_OR_DEPARTMENT_OTHER): Payer: Self-pay | Admitting: Physical Therapy

## 2023-06-02 LAB — EXTERNAL GENERIC LAB PROCEDURE: COLOGUARD: NEGATIVE

## 2023-06-03 ENCOUNTER — Encounter (HOSPITAL_BASED_OUTPATIENT_CLINIC_OR_DEPARTMENT_OTHER): Payer: Self-pay | Admitting: Orthopaedic Surgery

## 2023-06-11 ENCOUNTER — Encounter (HOSPITAL_BASED_OUTPATIENT_CLINIC_OR_DEPARTMENT_OTHER): Payer: Self-pay | Admitting: Physical Therapy

## 2023-06-11 ENCOUNTER — Ambulatory Visit (HOSPITAL_BASED_OUTPATIENT_CLINIC_OR_DEPARTMENT_OTHER): Payer: Medicare Other | Attending: Orthopaedic Surgery | Admitting: Physical Therapy

## 2023-06-11 DIAGNOSIS — M6281 Muscle weakness (generalized): Secondary | ICD-10-CM | POA: Insufficient documentation

## 2023-06-11 DIAGNOSIS — R262 Difficulty in walking, not elsewhere classified: Secondary | ICD-10-CM | POA: Diagnosis present

## 2023-06-11 DIAGNOSIS — M67959 Unspecified disorder of synovium and tendon, unspecified thigh: Secondary | ICD-10-CM | POA: Insufficient documentation

## 2023-06-11 DIAGNOSIS — M25551 Pain in right hip: Secondary | ICD-10-CM | POA: Diagnosis present

## 2023-06-11 NOTE — Therapy (Signed)
OUTPATIENT PHYSICAL THERAPY TREATMENT   Patient Name: Angela Sims MRN: [id number] DOB:[birth year], 52 y.o., female Today's Date: 06/11/2023  END OF SESSION:  PT End of Session - 06/11/23 0957     Visit Number 2    Number of Visits 13    Date for PT Re-Evaluation 08/13/23    PT Start Time 0957    PT Stop Time 1040    PT Time Calculation (min) 43 min    Activity Tolerance Patient tolerated treatment well    Behavior During Therapy [facility] for tasks assessed/performed              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   High cholesterol    Myofascial pain syndrome    PONV (postoperative nausea and vomiting) 12/05/2015   Pseudoarthrosis of lumbar spine    Restless leg syndrome    Seasonal allergies    Past Surgical History:  Procedure Laterality Date   ABDOMINAL EXPOSURE N/A 12/05/2015   Procedure: ABDOMINAL EXPOSURE;  Surgeon: Larina Earthly, MD;  [facility];  Service: Vascular;  Laterality: N/A;   ANTERIOR CERVICAL DECOMP/DISCECTOMY FUSION N/A 09/29/2022   Procedure: Anterior Cervical Discectomy Fusion Cervical Four-Cervical Five;  Surgeon: Tia Alert, MD;  Location: [facility] OR;  Service: Neurosurgery;  Laterality: N/A;  3C   ANTERIOR LUMBAR FUSION N/A 12/05/2015   Procedure: ANTERIOR LUMBAR FUSION LUMBAR FIVE-SACRAL ONE ;  Surgeon: Tia Alert, MD;  [facility]: [facility];  Service: Neurosurgery;  Laterality: N/A;  Abdominal approach   CERVICAL DISC ARTHROPLASTY N/A 01/22/2016   Procedure: C5-6 Cervical artificial disc replacment;  Surgeon: Tia Alert, MD;  [facility]: [facility];  Service: Neurosurgery;  Laterality: N/A;  C5-6 Cervical artificial disc replacment   CHOLECYSTECTOMY  08/09/2022   GLUTEUS MINIMUS REPAIR Right 05/10/2023   Procedure: RIGHT GLUTEUS MEDIUS REPAIR WITH COLLAGEN PATCH AUGMENTATION;   Surgeon: Huel Cote, MD;  Location: [facility];  Service: Orthopedics;  Laterality: Right;   LAMINECTOMY WITH POSTERIOR LATERAL ARTHRODESIS LEVEL 1 N/A 05/12/2018   Procedure: Posterior lateral fusion - Lumbar five-Sacral one with non-segmental fixation;  Surgeon: Tia Alert, MD;  Location: [facility] OR;  Service: Neurosurgery;  Laterality: N/A;   LUMBAR FUSION  12/05/2015   LUMBAR LAMINECTOMY/DECOMPRESSION MICRODISCECTOMY Right 06/25/2015   Procedure: LUMBAR LAMINECTOMY/DECOMPRESSION MICRODISCECTOMY 1 LEVEL; RIGHT ;  Surgeon: Tia Alert, MD;  [facility];  Service: Neurosurgery;  Laterality: Right;   WISDOM TOOTH EXTRACTION     Patient Active Problem List   Diagnosis Date Noted   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/2023   S/P cervical spinal fusion 09/29/2022   Failed back syndrome 10/05/2017   Neck pain 10/05/2017   Cervical spondylosis without myelopathy 09/30/2016   Radiculopathy of lumbar region 01/22/2016   Cervical radiculopathy 12/22/2015   S/P lumbar spinal fusion 12/05/2015   Other intervertebral disc degeneration, lumbosacral region 10/14/2015   S/P lumbar laminectomy 06/25/2015     REFERRING PROVIDER:  Huel Cote, MD   REFERRING DIAG:  [phone] (ICD-10-CM) - Tendinopathy of gluteus medius    post op  R glute med repair  Rationale for Evaluation and Treatment: Rehabilitation  THERAPY DIAG:  Pain in right hip  Muscle weakness (generalized)  Difficulty in walking, not elsewhere classified  ONSET DATE:  DOS 9/3   SUBJECTIVE:                                                                                                                                                                                           SUBJECTIVE STATEMENT: Feeling pretty good. Having a hard time sleeping. Right ankle is bothering me a little. Pain MD is sending a Rx for left shoulder.  Hip stabbing & burning. Back stabbing in the middle. Worse at night.   Incision feels good.  Sciatic pain is completely gone.   PERTINENT HISTORY:  L5/S1 fusion, cervical surgery x3  PAIN:  Are you having pain? No  PRECAUTIONS:  None  RED FLAGS: None   WEIGHT BEARING RESTRICTIONS:  Yes POSTOPERATIVE PLAN: She will be touchdown weightbearing for 2 weeks  FALLS:  Has patient fallen in last 6 months? No  LIVING ENVIRONMENT: 3 steps to enter, then 2 and both have a railing  OCCUPATION:  Not working  PLOF:  Independent  PATIENT GOALS:  Return to walking for exercise, stairs, hiking  OBJECTIVE:   PATIENT SURVEYS:  FOTO 32  COGNITIVE STATUS: Within functional limits for tasks assessed   SENSATION: WFL  POSTURE:  Eval: hip flexion in standing to be on toe   GAIT: Eval: TDWB with 2 wheeled walker   Body Part #1 Hip  PALPATION: Eval: guarded end feel, no warmth or redness around bandage  LOWER EXTREMITY ROM:     Passive  Right eval   Hip flexion 90   Hip extension    Hip abduction    Hip adduction    Hip internal rotation    Hip external rotation    Knee flexion    Knee extension    Ankle dorsiflexion    Ankle plantarflexion    Ankle inversion    Ankle eversion     (Blank rows = not tested)      TREATMENT:  Treatment                            06/11/23:  thomas stretch with knee to chest Bridge with ball bw knees Gastroc stretch Gait: glut set for hip extension, retro stepping also Tandem- added head turns Heel raises with ball bw ankles   DATE: EVAL 9/10 Aquacel present- bandage not changed See HEP Gait training with RW- touch down with heel strike      PATIENT EDUCATION:  Education details: Anatomy of condition, POC, HEP, exercise form/rationale Person educated: Patient and Spouse Education method: Explanation, Demonstration, Tactile cues, Verbal cues, and  Handouts Education comprehension: verbalized understanding, returned demonstration, verbal cues required, tactile cues required, and needs further education  HOME EXERCISE PROGRAM: [id number]   ASSESSMENT:  CLINICAL IMPRESSION: Difficulty avoiding eversion in balance exercises creating lateral strain along LE. Worked on opening anterior hip in gait and reducing limp for compensation. Looking at schedule for aquatic appointments & will add shoulder when Rx is received.     REHAB POTENTIAL: Good  CLINICAL DECISION MAKING: Stable/uncomplicated  EVALUATION COMPLEXITY: Low   GOALS: Goals reviewed with patient? Yes  SHORT TERM GOALS: Target date: 10/5  Incision healing well for progression to aquatics Baseline: Goal status: achieved 10/5  2.  Ambulate household distances without AD, pain <2/10 Baseline:  Goal status: achieved 10/5    LONG TERM GOALS: Target date: POC date   Begin progression of walking for exercise program Baseline:  Goal status: INITIAL  2.  Good activation of glut med against gravity in sidelying Baseline:  Goal status: INITIAL  3.  Meet FOTO goal Baseline:  Goal status: INITIAL  4.  Independent in long term HEP Baseline:  Goal status: INITIAL   PLAN:  PT FREQUENCY: 1-2x/week  PT DURATION: 12 weeks  PLANNED INTERVENTIONS: Therapeutic exercises, Therapeutic activity, Neuromuscular re-education, Balance training, Gait training, Patient/Family education, Self Care, Joint mobilization, Stair training, Aquatic Therapy, Dry Needling, Spinal mobilization, Cryotherapy, Moist heat, Taping, Manual therapy, and Re-evaluation.  PLAN FOR NEXT SESSION: continue per protocol   Charletta Voight C. Jessicca Stitzer PT, DPT 06/11/23 10:42 AM

## 2023-06-13 ENCOUNTER — Ambulatory Visit (HOSPITAL_BASED_OUTPATIENT_CLINIC_OR_DEPARTMENT_OTHER): Payer: Medicare Other | Admitting: Physical Therapy

## 2023-06-13 ENCOUNTER — Encounter (HOSPITAL_BASED_OUTPATIENT_CLINIC_OR_DEPARTMENT_OTHER): Payer: Self-pay | Admitting: Physical Therapy

## 2023-06-13 DIAGNOSIS — R262 Difficulty in walking, not elsewhere classified: Secondary | ICD-10-CM

## 2023-06-13 DIAGNOSIS — M25551 Pain in right hip: Secondary | ICD-10-CM

## 2023-06-13 DIAGNOSIS — M6281 Muscle weakness (generalized): Secondary | ICD-10-CM

## 2023-06-13 NOTE — Therapy (Signed)
OUTPATIENT PHYSICAL THERAPY TREATMENT   Patient Name: Angela Sims MRN: [id number] DOB:[birth year], 52 y.o., female Today's Date: 06/13/2023  END OF SESSION:  PT End of Session - 06/13/23 1530     Visit Number 3    Number of Visits 13    Date for PT Re-Evaluation 08/13/23    PT Start Time 1530    PT Stop Time 1607    PT Time Calculation (min) 37 min    Activity Tolerance Patient tolerated treatment well    Behavior During Therapy [facility] for tasks assessed/performed              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   High cholesterol    Myofascial pain syndrome    PONV (postoperative nausea and vomiting) 12/05/2015   Pseudoarthrosis of lumbar spine    Restless leg syndrome    Seasonal allergies    Past Surgical History:  Procedure Laterality Date   ABDOMINAL EXPOSURE N/A 12/05/2015   Procedure: ABDOMINAL EXPOSURE;  Surgeon: Larina Earthly, MD;  [facility];  Service: Vascular;  Laterality: N/A;   ANTERIOR CERVICAL DECOMP/DISCECTOMY FUSION N/A 09/29/2022   Procedure: Anterior Cervical Discectomy Fusion Cervical Four-Cervical Five;  Surgeon: Tia Alert, MD;  Location: [facility] OR;  Service: Neurosurgery;  Laterality: N/A;  3C   ANTERIOR LUMBAR FUSION N/A 12/05/2015   Procedure: ANTERIOR LUMBAR FUSION LUMBAR FIVE-SACRAL ONE ;  Surgeon: Tia Alert, MD;  [facility];  Service: Neurosurgery;  Laterality: N/A;  Abdominal approach   CERVICAL DISC ARTHROPLASTY N/A 01/22/2016   Procedure: C5-6 Cervical artificial disc replacment;  Surgeon: Tia Alert, MD;  [facility];  Service: Neurosurgery;  Laterality: N/A;  C5-6 Cervical artificial disc replacment   CHOLECYSTECTOMY  08/09/2022   GLUTEUS MINIMUS REPAIR Right 05/10/2023   Procedure: RIGHT GLUTEUS MEDIUS REPAIR WITH COLLAGEN PATCH AUGMENTATION;   Surgeon: Huel Cote, MD;  Location: [facility];  Service: Orthopedics;  Laterality: Right;   LAMINECTOMY WITH POSTERIOR LATERAL ARTHRODESIS LEVEL 1 N/A 05/12/2018   Procedure: Posterior lateral fusion - Lumbar five-Sacral one with non-segmental fixation;  Surgeon: Tia Alert, MD;  Location: [facility] OR;  Service: Neurosurgery;  Laterality: N/A;   LUMBAR FUSION  12/05/2015   LUMBAR LAMINECTOMY/DECOMPRESSION MICRODISCECTOMY Right 06/25/2015   Procedure: LUMBAR LAMINECTOMY/DECOMPRESSION MICRODISCECTOMY 1 LEVEL; RIGHT ;  Surgeon: Tia Alert, MD;  [facility];  Service: Neurosurgery;  Laterality: Right;   WISDOM TOOTH EXTRACTION     Patient Active Problem List   Diagnosis Date Noted   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/2023   S/P cervical spinal fusion 09/29/2022   Failed back syndrome 10/05/2017   Neck pain 10/05/2017   Cervical spondylosis without myelopathy 09/30/2016   Radiculopathy of lumbar region 01/22/2016   Cervical radiculopathy 12/22/2015   S/P lumbar spinal fusion 12/05/2015   Other intervertebral disc degeneration, lumbosacral region 10/14/2015   S/P lumbar laminectomy 06/25/2015     REFERRING PROVIDER:  Huel Cote, MD   REFERRING DIAG:  [phone] (ICD-10-CM) - Tendinopathy of gluteus medius    post op  R glute med repair  Rationale for Evaluation and Treatment: Rehabilitation  THERAPY DIAG:  Pain in right hip  Muscle weakness (generalized)  Difficulty in walking, not elsewhere classified  ONSET DATE:  DOS 9/3   SUBJECTIVE:                                                                                                                                                                                           SUBJECTIVE STATEMENT: Some sharp pain when walking in grass. Ankles were sore but no pain.   PERTINENT HISTORY:  L5/S1 fusion, cervical surgery x3  PAIN:  Are you having pain? No  PRECAUTIONS:  None  RED  FLAGS: None   WEIGHT BEARING RESTRICTIONS:  Yes POSTOPERATIVE PLAN: She will be touchdown weightbearing for 2 weeks  FALLS:  Has patient fallen in last 6 months? No  LIVING ENVIRONMENT: 3 steps to enter, then 2 and both have a railing  OCCUPATION:  Not working  PLOF:  Independent  PATIENT GOALS:  Return to walking for exercise, stairs, hiking  OBJECTIVE:   PATIENT SURVEYS:  FOTO 32  COGNITIVE STATUS: Within functional limits for tasks assessed   SENSATION: WFL  POSTURE:  Eval: hip flexion in standing to be on toe   GAIT: Eval: TDWB with 2 wheeled walker   Body Part #1 Hip  PALPATION: Eval: guarded end feel, no warmth or redness around bandage  LOWER EXTREMITY ROM:     Passive  Right eval   Hip flexion 90   Hip extension    Hip abduction    Hip adduction    Hip internal rotation    Hip external rotation    Knee flexion    Knee extension    Ankle dorsiflexion    Ankle plantarflexion    Ankle inversion    Ankle eversion     (Blank rows = not tested)      TREATMENT:                                                                                                                               Treatment                            10/7:  Bike 4 min L1 STM glut med Sidelying clams Supine HS & ITB stretch with strap Seated hip hinge with dowel Sit<>stand with dowel on spine; also without dowel Single leg hinge to bar Gastroc stretch on slant board   Treatment                            06/11/23:  thomas stretch with knee to chest Bridge with ball bw knees Gastroc stretch Gait: glut set for hip extension, retro stepping also Tandem- added head turns Heel raises with ball bw ankles   DATE: EVAL 9/10 Aquacel present- bandage not changed See HEP Gait training with RW- touch down with heel strike      PATIENT EDUCATION:  Education details: Anatomy of condition, POC, HEP, exercise form/rationale Person educated: Patient and  Spouse Education method: Explanation, Demonstration, Tactile cues, Verbal cues, and Handouts Education comprehension: verbalized understanding, returned demonstration, verbal cues required, tactile cues required, and needs further education  HOME EXERCISE PROGRAM: [id number]   ASSESSMENT:  CLINICAL IMPRESSION: Tendency toward valgus in sit to stand. Improved hip hinge while reducing thoracic kyphosis. Tightness in gastrocs contributing to lateral instability in ankles and strain on lateral hip.     REHAB POTENTIAL: Good  CLINICAL DECISION MAKING: Stable/uncomplicated  EVALUATION COMPLEXITY: Low   GOALS: Goals reviewed with patient? Yes  SHORT TERM GOALS: Target date: 10/5  Incision healing well for progression to aquatics Baseline: Goal status: achieved 10/5  2.  Ambulate household distances without AD, pain <2/10 Baseline:  Goal status: achieved 10/5    LONG TERM GOALS: Target date: POC date   Begin progression of walking for exercise program Baseline:  Goal status: INITIAL  2.  Good activation of glut med against gravity in sidelying Baseline:  Goal status: INITIAL  3.  Meet FOTO goal Baseline:  Goal status: INITIAL  4.  Independent in long term HEP Baseline:  Goal status: INITIAL   PLAN:  PT FREQUENCY: 1-2x/week  PT DURATION: 12 weeks  PLANNED INTERVENTIONS: Therapeutic exercises, Therapeutic activity, Neuromuscular re-education, Balance training, Gait training, Patient/Family education, Self Care, Joint mobilization, Stair training, Aquatic Therapy, Dry Needling, Spinal mobilization, Cryotherapy, Moist heat, Taping, Manual therapy, and Re-evaluation.  PLAN FOR NEXT SESSION: continue per protocol   Miata Culbreth C. Fynlee Rowlands PT, DPT 06/13/23 4:11 PM

## 2023-06-15 ENCOUNTER — Ambulatory Visit (HOSPITAL_BASED_OUTPATIENT_CLINIC_OR_DEPARTMENT_OTHER): Payer: Self-pay | Admitting: Physical Therapy

## 2023-06-18 ENCOUNTER — Other Ambulatory Visit: Payer: Self-pay | Admitting: Orthopaedic Surgery

## 2023-06-20 ENCOUNTER — Ambulatory Visit (HOSPITAL_BASED_OUTPATIENT_CLINIC_OR_DEPARTMENT_OTHER): Payer: Medicare Other | Admitting: Physical Therapy

## 2023-06-20 ENCOUNTER — Encounter (HOSPITAL_BASED_OUTPATIENT_CLINIC_OR_DEPARTMENT_OTHER): Payer: Self-pay | Admitting: Physical Therapy

## 2023-06-20 ENCOUNTER — Ambulatory Visit (INDEPENDENT_AMBULATORY_CARE_PROVIDER_SITE_OTHER): Payer: Medicare Other | Admitting: Orthopaedic Surgery

## 2023-06-20 ENCOUNTER — Other Ambulatory Visit (HOSPITAL_BASED_OUTPATIENT_CLINIC_OR_DEPARTMENT_OTHER): Payer: Self-pay

## 2023-06-20 DIAGNOSIS — M6281 Muscle weakness (generalized): Secondary | ICD-10-CM

## 2023-06-20 DIAGNOSIS — R262 Difficulty in walking, not elsewhere classified: Secondary | ICD-10-CM

## 2023-06-20 DIAGNOSIS — M67959 Unspecified disorder of synovium and tendon, unspecified thigh: Secondary | ICD-10-CM

## 2023-06-20 DIAGNOSIS — M25551 Pain in right hip: Secondary | ICD-10-CM

## 2023-06-20 MED ORDER — METHOCARBAMOL 500 MG PO TABS
500.0000 mg | ORAL_TABLET | Freq: Four times a day (QID) | ORAL | 3 refills | Status: AC
  Filled 2023-06-20: qty 30, 8d supply, fill #0
  Filled 2023-06-28: qty 30, 8d supply, fill #1
  Filled 2023-08-01: qty 30, 8d supply, fill #2

## 2023-06-20 NOTE — Progress Notes (Signed)
Post Operative Evaluation    Procedure/Date of Surgery: Right gluteus medius tendon repair 9/3  Interval History:    Presents today for follow-up status post the above procedure.  At today's visit she is having a hard time laying on either the right or the left side.  This is resulting in left upper neck pain and trapezius type pain which is rating to the shoulder.  Her weightbearing is improving as well as her strength and range of motion with physical therapy   PMH/PSH/Family History/Social History/Meds/Allergies:    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   High cholesterol    Myofascial pain syndrome    PONV (postoperative nausea and vomiting) 12/05/2015   Pseudoarthrosis of lumbar spine    Restless leg syndrome    Seasonal allergies    Past Surgical History:  Procedure Laterality Date   ABDOMINAL EXPOSURE N/A 12/05/2015   Procedure: ABDOMINAL EXPOSURE;  Surgeon: Larina Earthly, MD;  [facility]: [facility];  Service: Vascular;  Laterality: N/A;   ANTERIOR CERVICAL DECOMP/DISCECTOMY FUSION N/A 09/29/2022   Procedure: Anterior Cervical Discectomy Fusion Cervical Four-Cervical Five;  Surgeon: Tia Alert, MD;  Location: [facility] OR;  Service: Neurosurgery;  Laterality: N/A;  3C   ANTERIOR LUMBAR FUSION N/A 12/05/2015   Procedure: ANTERIOR LUMBAR FUSION LUMBAR FIVE-SACRAL ONE ;  Surgeon: Tia Alert, MD;  [facility];  Service: Neurosurgery;  Laterality: N/A;  Abdominal approach   CERVICAL DISC ARTHROPLASTY N/A 01/22/2016   Procedure: C5-6 Cervical artificial disc replacment;  Surgeon: Tia Alert, MD;  [facility];  Service: Neurosurgery;  Laterality: N/A;  C5-6 Cervical artificial disc replacment   CHOLECYSTECTOMY  08/09/2022   GLUTEUS MINIMUS REPAIR Right 05/10/2023    Procedure: RIGHT GLUTEUS MEDIUS REPAIR WITH COLLAGEN PATCH AUGMENTATION;  Surgeon: Huel Cote, MD;  Location: [facility];  Service: Orthopedics;  Laterality: Right;   LAMINECTOMY WITH POSTERIOR LATERAL ARTHRODESIS LEVEL 1 N/A 05/12/2018   Procedure: Posterior lateral fusion - Lumbar five-Sacral one with non-segmental fixation;  Surgeon: Tia Alert, MD;  Location: [facility] OR;  Service: Neurosurgery;  Laterality: N/A;   LUMBAR FUSION  12/05/2015   LUMBAR LAMINECTOMY/DECOMPRESSION MICRODISCECTOMY Right 06/25/2015   Procedure: LUMBAR LAMINECTOMY/DECOMPRESSION MICRODISCECTOMY 1 LEVEL; RIGHT ;  Surgeon: Tia Alert, MD;  [facility];  Service: Neurosurgery;  Laterality: Right;   WISDOM TOOTH EXTRACTION     Social History   Socioeconomic History   Marital status: Married    Spouse name: Not on file   Number of children: 1   Years of education: Not on file   Highest education level: Not on file  Occupational History   Not on file  Tobacco Use   Smoking status: Never   Smokeless tobacco: Never  Vaping Use   Vaping status: Never Used  Substance and Sexual Activity   Alcohol use: Not Currently   Drug use: No   Sexual activity: Not on file  Other Topics Concern   Not on file  Social History Narrative   Not on file   Social Determinants of Health   Financial  Resource Strain: Low Risk  (10/26/2022)   Received from [facility], [facility]   Overall Financial Resource Strain (CARDIA)    Difficulty of Paying Living Expenses: Not hard at all  Food Insecurity: No Food Insecurity (10/26/2022)   Received from [facility], [facility]   Hunger Vital Sign    Worried About Running Out of Food in the Last Year: Never true    Ran Out of Food in the Last Year: Never true  Transportation Needs: No Transportation Needs (10/26/2022)   Received from [facility]   [facility] - Transportation    Lack of Transportation (Medical): No    Lack of  Transportation (Non-Medical): No  Physical Activity: Inactive (10/26/2022)   Received from [facility], [facility]   Exercise Vital Sign    Days of Exercise per Week: 0 days    Minutes of Exercise per Session: 30 min  Stress: No Stress Concern Present (10/26/2022)   Received from [facility], [facility] of Occupational Health - Occupational Stress Questionnaire    Feeling of Stress : Not at all  Social Connections: Socially Integrated (07/09/2022)   Received from [facility], [facility]   Social Network    How would you rate your social network (family, work, friends)?: Good participation with social networks   Family History  Problem Relation Age of Onset   Bladder Cancer Mother    Alcohol abuse Father    Allergies  Allergen Reactions   Clavulanic Acid Other (See Comments)    Unknown   Cyclobenzaprine Other (See Comments)    Pt stated, "I got restless leg syndrome with this medication"   Lactose Intolerance (Gi) Other (See Comments)    Gassy / Cramps   Gabapentin Other (See Comments)    moody   Penicillins Rash    Has patient had a PCN reaction causing immediate rash, facial/tongue/throat swelling, SOB or lightheadedness with hypotension: Yes Has patient had a PCN reaction causing severe rash involving mucus membranes or skin necrosis: No Has patient had a PCN reaction that required hospitalization: No Has patient had a PCN reaction occurring within the last 10 years: Yes If all of the above answers are "NO", then may proceed with Cephalosporin use.   Current Outpatient Medications  Medication Sig Dispense Refill   methocarbamol (ROBAXIN) 500 MG tablet Take 1 tablet (500 mg total) by mouth 4 (four) times daily. 30 tablet 3   albuterol (PROVENTIL HFA;VENTOLIN HFA) 108 (90 BASE) MCG/ACT inhaler Inhale 1-2 puffs into the lungs daily as needed for wheezing or shortness of breath.      aspirin EC 325 MG tablet Take 1 tablet (325 mg total) by mouth  daily. 30 tablet 0   cephALEXin (KEFLEX) 500 MG capsule Take 500 mg by mouth 4 (four) times daily.     [employer] FIBER GUMMIES PO Take by mouth.     fluticasone (FLONASE) 50 MCG/ACT nasal spray Place 1 spray into both nostrils daily as needed for allergies.      HYDROcodone-acetaminophen (NORCO/VICODIN) 5-325 MG tablet Take 1 tablet by mouth every 6 (six) hours as needed for moderate pain. 25 tablet 0   oxycodone (OXY-IR) 5 MG capsule Take 1 capsule (5 mg total) by mouth every 4 (four) hours as needed (severe pain). 20 capsule 0   rosuvastatin (CRESTOR) 5 MG tablet Take 5 mg by mouth at bedtime.     scopolamine (TRANSDERM-SCOP) 1 MG/3DAYS Place 1 patch (1.5 mg total) onto the skin  every 3 (three) days. 10 patch 12   tiZANidine (ZANAFLEX) 2 MG tablet Take 1 tablet (2 mg total) by mouth every 8 (eight) hours as needed (Neck pain). 30 tablet 0   traZODone (DESYREL) 50 MG tablet Take 50-100 mg by mouth at bedtime.     Upadacitinib ER (RINVOQ) 15 MG TB24 Take 15 mg by mouth daily.     No current facility-administered medications for this visit.   No results found.  Review of Systems:   A ROS was performed including pertinent positives and negatives as documented in the HPI.   Musculoskeletal Exam:    Right hip incision is well-appearing without erythema or drainage.  30 degrees internal/external rotation about the right hip.  Able to flex to 90 degrees.  Improved hip abduction.  There is tenderness about the left upper trapezius which is sore to palpation.  Distal neurosensory exam is intact with  Imaging:      I personally reviewed and interpreted the radiographs.   Assessment:   6 weeks status post right hip gluteus medius repair overall doing extremely well.  At this time I do believe she would benefit from aquatic therapy to work on the hip.  I would also like her to address her upper cervical issues with physical therapy as well to work on range of motion of the neck as well as the  trapezius.  I have provided her with a prescription of Robaxin to help as her muscle relaxer and she does appear to be in somewhat of a flareup for her rheumatologic illness  Plan :    -Return to clinic 6 weeks for reassessment      I personally saw and evaluated the patient, and participated in the management and treatment plan.  Huel Cote, MD Attending Physician, Orthopedic Surgery  This document was dictated using Dragon voice recognition software. A reasonable attempt at proof reading has been made to minimize errors.

## 2023-06-20 NOTE — Therapy (Signed)
OUTPATIENT PHYSICAL THERAPY TREATMENT   Patient Name: Angela Sims MRN: [id number] DOB:April 22, 1971, 52 y.o., female Today's Date: 06/20/2023  END OF SESSION:  PT End of Session - 06/20/23 1034     Visit Number 4    Number of Visits 13    Date for PT Re-Evaluation 08/13/23    Activity Tolerance Patient tolerated treatment well    Behavior During Therapy [facility] for tasks assessed/performed              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   High cholesterol    Myofascial pain syndrome    PONV (postoperative nausea and vomiting) 12/05/2015   Pseudoarthrosis of lumbar spine    Restless leg syndrome    Seasonal allergies    Past Surgical History:  Procedure Laterality Date   ABDOMINAL EXPOSURE N/A 12/05/2015   Procedure: ABDOMINAL EXPOSURE;  Surgeon: Larina Earthly, MD;  Location: [facility];  Service: Vascular;  Laterality: N/A;   ANTERIOR CERVICAL DECOMP/DISCECTOMY FUSION N/A 09/29/2022   Procedure: Anterior Cervical Discectomy Fusion Cervical Four-Cervical Five;  Surgeon: Tia Alert, MD;  Location: [facility] OR;  Service: Neurosurgery;  Laterality: N/A;  3C   ANTERIOR LUMBAR FUSION N/A 12/05/2015   Procedure: ANTERIOR LUMBAR FUSION LUMBAR FIVE-SACRAL ONE ;  Surgeon: Tia Alert, MD;  Location: [facility];  Service: Neurosurgery;  Laterality: N/A;  Abdominal approach   CERVICAL DISC ARTHROPLASTY N/A 01/22/2016   Procedure: C5-6 Cervical artificial disc replacment;  Surgeon: Tia Alert, MD;  Location: [facility];  Service: Neurosurgery;  Laterality: N/A;  C5-6 Cervical artificial disc replacment   CHOLECYSTECTOMY  08/09/2022   GLUTEUS MINIMUS REPAIR Right 05/10/2023   Procedure: RIGHT GLUTEUS MEDIUS REPAIR WITH COLLAGEN PATCH AUGMENTATION;  Surgeon: Huel Cote, MD;  Location: [facility];  Service:  Orthopedics;  Laterality: Right;   LAMINECTOMY WITH POSTERIOR LATERAL ARTHRODESIS LEVEL 1 N/A 05/12/2018   Procedure: Posterior lateral fusion - Lumbar five-Sacral one with non-segmental fixation;  Surgeon: Tia Alert, MD;  Location: [facility] OR;  Service: Neurosurgery;  Laterality: N/A;   LUMBAR FUSION  12/05/2015   LUMBAR LAMINECTOMY/DECOMPRESSION MICRODISCECTOMY Right 06/25/2015   Procedure: LUMBAR LAMINECTOMY/DECOMPRESSION MICRODISCECTOMY 1 LEVEL; RIGHT ;  Surgeon: Tia Alert, MD;  Location: [facility];  Service: Neurosurgery;  Laterality: Right;   WISDOM TOOTH EXTRACTION     Patient Active Problem List   Diagnosis Date Noted   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/2023   S/P cervical spinal fusion 09/29/2022   Failed back syndrome 10/05/2017   Neck pain 10/05/2017   Cervical spondylosis without myelopathy 09/30/2016   Radiculopathy of lumbar region 01/22/2016   Cervical radiculopathy 12/22/2015   S/P lumbar spinal fusion 12/05/2015   Other intervertebral disc degeneration, lumbosacral region 10/14/2015   S/P lumbar laminectomy 06/25/2015     REFERRING PROVIDER:  Huel Cote, MD   REFERRING DIAG:  [phone] (ICD-10-CM) - Tendinopathy of gluteus medius    post op  R glute med repair  Rationale for Evaluation and Treatment: Rehabilitation  THERAPY DIAG:  Pain in right hip  Muscle weakness (generalized)  Difficulty in walking, not elsewhere classified  ONSET DATE: DOS 9/3   SUBJECTIVE:  SUBJECTIVE STATEMENT: Some sharp pain when walking in grass. Ankles were sore but no pain.   PERTINENT HISTORY:  L5/S1 fusion, cervical surgery x3  PAIN:  Are you having pain? 4/10 right hip  PRECAUTIONS:  None  RED FLAGS: None   WEIGHT BEARING RESTRICTIONS:  Yes POSTOPERATIVE PLAN: She  will be touchdown weightbearing for 2 weeks  FALLS:  Has patient fallen in last 6 months? No  LIVING ENVIRONMENT: 3 steps to enter, then 2 and both have a railing  OCCUPATION:  Not working  PLOF:  Independent  PATIENT GOALS:  Return to walking for exercise, stairs, hiking  OBJECTIVE:   PATIENT SURVEYS:  FOTO 32  COGNITIVE STATUS: Within functional limits for tasks assessed   SENSATION: WFL  POSTURE:  Eval: hip flexion in standing to be on toe   GAIT: Eval: TDWB with 2 wheeled walker   Body Part #1 Hip  PALPATION: Eval: guarded end feel, no warmth or redness around bandage  LOWER EXTREMITY ROM:     Passive  Right eval   Hip flexion 90   Hip extension    Hip abduction    Hip adduction    Hip internal rotation    Hip external rotation    Knee flexion    Knee extension    Ankle dorsiflexion    Ankle plantarflexion    Ankle inversion    Ankle eversion     (Blank rows = not tested)      TREATMENT:                                                                                                                              Treatment                            10/14  Pt seen for aquatic therapy today.  Treatment took place in water 3.5-4.75 ft in depth at the [facility] pool. Temp of water was 91.  Pt entered/exited the pool via stair using step to pattern with hand rail.  *intro to setting *walking 4.75ft forward, back and side ways unsupported x 4 widths ea *standing ue on wall: clams R/L; hip add/abd; hip ext *squatted rest period x 2 between above exercises *Cycling on noodle: 2 widths (some right hip discomfort); hip add/abd x 20; hip flex/ext x 20 *decompression on noodle *3 way stretch on noodle: hamstring, gastroc, adductor and IT band *Hip hinge: verbal and TC fore execution. 2 x 5.  Cues for pause to gin LB stretch *STS from 3rd water step  Pt requires the buoyancy and hydrostatic pressure of water for support, and to offload  joints by unweighting joint load by at least 50 % in navel deep water and by at least 75-80% in chest to neck deep water.  Viscosity of the water is needed for resistance of strengthening. Water current perturbations provides challenge to standing balance requiring increased  core activation.     Treatment                            10/7:  Bike 4 min L1 STM glut med Sidelying clams Supine HS & ITB stretch with strap Seated hip hinge with dowel Sit<>stand with dowel on spine; also without dowel Single leg hinge to bar Gastroc stretch on slant board   Treatment                            06/11/23:  thomas stretch with knee to chest Bridge with ball bw knees Gastroc stretch Gait: glut set for hip extension, retro stepping also Tandem- added head turns Heel raises with ball bw ankles   DATE: EVAL 9/10 Aquacel present- bandage not changed See HEP Gait training with RW- touch down with heel strike      PATIENT EDUCATION:  Education details: Anatomy of condition, POC, HEP, exercise form/rationale Person educated: Patient and Spouse Education method: Explanation, Demonstration, Tactile cues, Verbal cues, and Handouts Education comprehension: verbalized understanding, returned demonstration, verbal cues required, tactile cues required, and needs further education  HOME EXERCISE PROGRAM: [id number]   ASSESSMENT:  CLINICAL IMPRESSION: Pt demonstrates safety and indep in setting with therapist instructing from deck. She demonstrates ability to find COB without difficulty. Focused on glute ROM/stretching and strengthening coupled with back engagement (hx of multiple back surgeries).  She engages well with good toleration.  Cues for decreased valgus with STS. She is a good candidate for aquatic intervention and will benefit from the properties of water to progress towards land based goals.     REHAB POTENTIAL: Good  CLINICAL DECISION MAKING: Stable/uncomplicated  EVALUATION  COMPLEXITY: Low   GOALS: Goals reviewed with patient? Yes  SHORT TERM GOALS: Target date: 10/5  Incision healing well for progression to aquatics Baseline: Goal status: achieved 10/5  2.  Ambulate household distances without AD, pain <2/10 Baseline:  Goal status: achieved 10/5    LONG TERM GOALS: Target date: POC date   Begin progression of walking for exercise program Baseline:  Goal status: INITIAL  2.  Good activation of glut med against gravity in sidelying Baseline:  Goal status: INITIAL  3.  Meet FOTO goal Baseline:  Goal status: INITIAL  4.  Independent in long term HEP Baseline:  Goal status: INITIAL   PLAN:  PT FREQUENCY: 1-2x/week  PT DURATION: 12 weeks  PLANNED INTERVENTIONS: Therapeutic exercises, Therapeutic activity, Neuromuscular re-education, Balance training, Gait training, Patient/Family education, Self Care, Joint mobilization, Stair training, Aquatic Therapy, Dry Needling, Spinal mobilization, Cryotherapy, Moist heat, Taping, Manual therapy, and Re-evaluation.  PLAN FOR NEXT SESSION: continue per protocol   Corrie Dandy Tomma Lightning) Hershy Flenner MPT 06/20/23 10:34 AM

## 2023-06-21 ENCOUNTER — Encounter (HOSPITAL_BASED_OUTPATIENT_CLINIC_OR_DEPARTMENT_OTHER): Payer: Medicare Other

## 2023-06-27 ENCOUNTER — Encounter (HOSPITAL_BASED_OUTPATIENT_CLINIC_OR_DEPARTMENT_OTHER): Payer: Self-pay | Admitting: Physical Therapy

## 2023-06-27 ENCOUNTER — Ambulatory Visit (HOSPITAL_BASED_OUTPATIENT_CLINIC_OR_DEPARTMENT_OTHER): Payer: Medicare Other | Admitting: Physical Therapy

## 2023-06-27 DIAGNOSIS — M25551 Pain in right hip: Secondary | ICD-10-CM

## 2023-06-27 DIAGNOSIS — M6281 Muscle weakness (generalized): Secondary | ICD-10-CM

## 2023-06-27 DIAGNOSIS — R262 Difficulty in walking, not elsewhere classified: Secondary | ICD-10-CM

## 2023-06-27 NOTE — Therapy (Signed)
OUTPATIENT PHYSICAL THERAPY TREATMENT   Patient Name: Angela Sims MRN: [id number] DOB:[birth year], 52 y.o., female Today's Date: 06/27/2023  END OF SESSION:  PT End of Session - 06/27/23 1148     Visit Number 5    Number of Visits 13    Date for PT Re-Evaluation 08/13/23    PT Start Time 1146    PT Stop Time 1226    PT Time Calculation (min) 40 min    Activity Tolerance Patient tolerated treatment well    Behavior During Therapy [facility] for tasks assessed/performed               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   High cholesterol    Myofascial pain syndrome    PONV (postoperative nausea and vomiting) 12/05/2015   Pseudoarthrosis of lumbar spine    Restless leg syndrome    Seasonal allergies    Past Surgical History:  Procedure Laterality Date   ABDOMINAL EXPOSURE N/A 12/05/2015   Procedure: ABDOMINAL EXPOSURE;  Surgeon: Larina Earthly, MD;  Location: [facility];  Service: Vascular;  Laterality: N/A;   ANTERIOR CERVICAL DECOMP/DISCECTOMY FUSION N/A 09/29/2022   Procedure: Anterior Cervical Discectomy Fusion Cervical Four-Cervical Five;  Surgeon: Tia Alert, MD;  Location: [facility] OR;  Service: Neurosurgery;  Laterality: N/A;  3C   ANTERIOR LUMBAR FUSION N/A 12/05/2015   Procedure: ANTERIOR LUMBAR FUSION LUMBAR FIVE-SACRAL ONE ;  Surgeon: Tia Alert, MD;  Location: [facility];  Service: Neurosurgery;  Laterality: N/A;  Abdominal approach   CERVICAL DISC ARTHROPLASTY N/A 01/22/2016   Procedure: C5-6 Cervical artificial disc replacment;  Surgeon: Tia Alert, MD;  Location: [facility];  Service: Neurosurgery;  Laterality: N/A;  C5-6 Cervical artificial disc replacment   CHOLECYSTECTOMY  08/09/2022   GLUTEUS MINIMUS REPAIR Right 05/10/2023   Procedure: RIGHT GLUTEUS MEDIUS REPAIR WITH COLLAGEN PATCH  AUGMENTATION;  Surgeon: Huel Cote, MD;  Location: [facility];  Service: Orthopedics;  Laterality: Right;   LAMINECTOMY WITH POSTERIOR LATERAL ARTHRODESIS LEVEL 1 N/A 05/12/2018   Procedure: Posterior lateral fusion - Lumbar five-Sacral one with non-segmental fixation;  Surgeon: Tia Alert, MD;  Location: [facility] OR;  Service: Neurosurgery;  Laterality: N/A;   LUMBAR FUSION  12/05/2015   LUMBAR LAMINECTOMY/DECOMPRESSION MICRODISCECTOMY Right 06/25/2015   Procedure: LUMBAR LAMINECTOMY/DECOMPRESSION MICRODISCECTOMY 1 LEVEL; RIGHT ;  Surgeon: Tia Alert, MD;  Location: [facility];  Service: Neurosurgery;  Laterality: Right;   WISDOM TOOTH EXTRACTION     Patient Active Problem List   Diagnosis Date Noted   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/2023   S/P cervical spinal fusion 09/29/2022   Failed back syndrome 10/05/2017   Neck pain 10/05/2017   Cervical spondylosis without myelopathy 09/30/2016   Radiculopathy of lumbar region 01/22/2016   Cervical radiculopathy 12/22/2015   S/P lumbar spinal fusion 12/05/2015   Other intervertebral disc degeneration, lumbosacral region 10/14/2015   S/P lumbar laminectomy 06/25/2015     REFERRING PROVIDER:  Huel Cote, MD   REFERRING DIAG:  [phone] (ICD-10-CM) - Tendinopathy of gluteus medius    post op  R glute med repair  Rationale for Evaluation and Treatment: Rehabilitation  THERAPY DIAG:  Pain in right hip  Muscle weakness (generalized)  Difficulty in walking, not elsewhere classified  ONSET  DATE: DOS 9/3   SUBJECTIVE:                                                                                                                                                                                           SUBJECTIVE STATEMENT: Dr Steward Drone gave me muscle relaxers because I was so tight, it was really bad around my neck. Taking relaxers and exercises helped with tightness.   PERTINENT HISTORY:  L5/S1 fusion,  cervical surgery x3  PAIN:  PAIN:  Are you having pain? Yes: NPRS scale: a little tightness/10 Pain location: Rt hip Pain description: tight Aggravating factors: driving 1 hour to get here Relieving factors: exercises.    PRECAUTIONS:  None  RED FLAGS: None   WEIGHT BEARING RESTRICTIONS:  Yes POSTOPERATIVE PLAN: She will be touchdown weightbearing for 2 weeks  FALLS:  Has patient fallen in last 6 months? No  LIVING ENVIRONMENT: 3 steps to enter, then 2 and both have a railing  OCCUPATION:  Not working  PLOF:  Independent  PATIENT GOALS:  Return to walking for exercise, stairs, hiking  OBJECTIVE:   PATIENT SURVEYS:  FOTO 32   FOTO 10/21: 66  COGNITIVE STATUS: Within functional limits for tasks assessed   SENSATION: WFL   GAIT: Eval: TDWB with 2 wheeled walker 10/21: Lt trendelenburg   Body Part #1 Hip  PALPATION: Eval: guarded end feel, no warmth or redness around bandage  LOWER EXTREMITY ROM:     Passive  Right eval   Hip flexion 90   Hip extension    Hip abduction    Hip adduction    Hip internal rotation    Hip external rotation    Knee flexion    Knee extension    Ankle dorsiflexion    Ankle plantarflexion    Ankle inversion    Ankle eversion     (Blank rows = not tested)      TREATMENT:  Treatment                            10/21:  Standing hip hike Added 3-ayer heel lift to right shoe Bridge with clam 5x10 Supine HSS- medial & lateral 5 breath holds. Supine piriformis stretch Seated hip hinge with bil LE turnout Weight shift into heel with glut activation   Treatment                            10/14  Pt seen for aquatic therapy today.  Treatment took place in water 3.5-4.75 ft in depth at the [facility] pool. Temp of water was 91.  Pt entered/exited the pool via stair using step to  pattern with hand rail.  *intro to setting *walking 4.73ft forward, back and side ways unsupported x 4 widths ea *standing ue on wall: clams R/L; hip add/abd; hip ext *squatted rest period x 2 between above exercises *Cycling on noodle: 2 widths (some right hip discomfort); hip add/abd x 20; hip flex/ext x 20 *decompression on noodle *3 way stretch on noodle: hamstring, gastroc, adductor and IT band *Hip hinge: verbal and TC fore execution. 2 x 5.  Cues for pause to gin LB stretch *STS from 3rd water step  Pt requires the buoyancy and hydrostatic pressure of water for support, and to offload joints by unweighting joint load by at least 50 % in navel deep water and by at least 75-80% in chest to neck deep water.  Viscosity of the water is needed for resistance of strengthening. Water current perturbations provides challenge to standing balance requiring increased core activation.     Treatment                            10/7:  Bike 4 min L1 STM glut med Sidelying clams Supine HS & ITB stretch with strap Seated hip hinge with dowel Sit<>stand with dowel on spine; also without dowel Single leg hinge to bar Gastroc stretch on slant board        PATIENT EDUCATION:  Education details: Anatomy of condition, POC, HEP, exercise form/rationale Person educated: Patient and Spouse Education method: Programmer, multimedia, Demonstration, Tactile cues, Verbal cues, and Handouts Education comprehension: verbalized understanding, returned demonstration, verbal cues required, tactile cues required, and needs further education  HOME EXERCISE PROGRAM: [id number] 3-layer heel lift in right shoe   ASSESSMENT:  CLINICAL IMPRESSION: Added heel lift to right shoe today to reduce discrepancy- asked that she wear the lift and message me tomorrow to determine outcome. S/s of chronic back pain, left trendelenburg and right ankle tightness paired with visual ASIS height difference called for a trial.  Encouraged  her to remove the lift should concordant pain increase.      REHAB POTENTIAL: Good  CLINICAL DECISION MAKING: Stable/uncomplicated  EVALUATION COMPLEXITY: Low   GOALS: Goals reviewed with patient? Yes  SHORT TERM GOALS: Target date: 10/5  Incision healing well for progression to aquatics Baseline: Goal status: achieved 10/5  2.  Ambulate household distances without AD, pain <2/10 Baseline:  Goal status: achieved 10/5    LONG TERM GOALS: Target date: POC date   Begin progression of walking for exercise program Baseline:  Goal status: INITIAL  2.  Good activation of glut med against gravity in sidelying Baseline:  Goal status: INITIAL  3.  Meet FOTO goal Baseline:  Goal  status: INITIAL  4.  Independent in long term HEP Baseline:  Goal status: INITIAL   PLAN:  PT FREQUENCY: 1-2x/week  PT DURATION: 12 weeks  PLANNED INTERVENTIONS: Therapeutic exercises, Therapeutic activity, Neuromuscular re-education, Balance training, Gait training, Patient/Family education, Self Care, Joint mobilization, Stair training, Aquatic Therapy, Dry Needling, Spinal mobilization, Cryotherapy, Moist heat, Taping, Manual therapy, and Re-evaluation.  PLAN FOR NEXT SESSION: continue per protocol   La Dibella C. Earnestine Shipp PT, DPT 06/27/23 12:36 PM

## 2023-06-29 ENCOUNTER — Encounter (HOSPITAL_BASED_OUTPATIENT_CLINIC_OR_DEPARTMENT_OTHER): Payer: Self-pay | Admitting: Physical Therapy

## 2023-06-29 ENCOUNTER — Ambulatory Visit (HOSPITAL_BASED_OUTPATIENT_CLINIC_OR_DEPARTMENT_OTHER): Payer: Medicare Other | Admitting: Physical Therapy

## 2023-06-29 ENCOUNTER — Other Ambulatory Visit (HOSPITAL_BASED_OUTPATIENT_CLINIC_OR_DEPARTMENT_OTHER): Payer: Self-pay

## 2023-06-29 DIAGNOSIS — M6281 Muscle weakness (generalized): Secondary | ICD-10-CM

## 2023-06-29 DIAGNOSIS — M25551 Pain in right hip: Secondary | ICD-10-CM | POA: Diagnosis not present

## 2023-06-29 DIAGNOSIS — R262 Difficulty in walking, not elsewhere classified: Secondary | ICD-10-CM

## 2023-06-29 NOTE — Therapy (Signed)
OUTPATIENT PHYSICAL THERAPY TREATMENT   Patient Name: Angela Sims MRN: [id number] DOB:1971-01-15, 52 y.o., female Today's Date: 06/29/2023  END OF SESSION:  PT End of Session - 06/29/23 0902     Visit Number 6    Number of Visits 13    Date for PT Re-Evaluation 08/13/23    PT Start Time 0902    PT Stop Time 0945    PT Time Calculation (min) 43 min    Activity Tolerance Patient tolerated treatment well    Behavior During Therapy [facility] for tasks assessed/performed               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   High cholesterol    Myofascial pain syndrome    PONV (postoperative nausea and vomiting) 12/05/2015   Pseudoarthrosis of lumbar spine    Restless leg syndrome    Seasonal allergies    Past Surgical History:  Procedure Laterality Date   ABDOMINAL EXPOSURE N/A 12/05/2015   Procedure: ABDOMINAL EXPOSURE;  Surgeon: Larina Earthly, MD;  Location: [facility];  Service: Vascular;  Laterality: N/A;   ANTERIOR CERVICAL DECOMP/DISCECTOMY FUSION N/A 09/29/2022   Procedure: Anterior Cervical Discectomy Fusion Cervical Four-Cervical Five;  Surgeon: Tia Alert, MD;  Location: [facility] OR;  Service: Neurosurgery;  Laterality: N/A;  3C   ANTERIOR LUMBAR FUSION N/A 12/05/2015   Procedure: ANTERIOR LUMBAR FUSION LUMBAR FIVE-SACRAL ONE ;  Surgeon: Tia Alert, MD;  Location: [facility];  Service: Neurosurgery;  Laterality: N/A;  Abdominal approach   CERVICAL DISC ARTHROPLASTY N/A 01/22/2016   Procedure: C5-6 Cervical artificial disc replacment;  Surgeon: Tia Alert, MD;  Location: [facility];  Service: Neurosurgery;  Laterality: N/A;  C5-6 Cervical artificial disc replacment   CHOLECYSTECTOMY  08/09/2022   GLUTEUS MINIMUS REPAIR Right 05/10/2023   Procedure: RIGHT GLUTEUS MEDIUS REPAIR WITH COLLAGEN PATCH  AUGMENTATION;  Surgeon: Huel Cote, MD;  Location: [facility];  Service: Orthopedics;  Laterality: Right;   LAMINECTOMY WITH POSTERIOR LATERAL ARTHRODESIS LEVEL 1 N/A 05/12/2018   Procedure: Posterior lateral fusion - Lumbar five-Sacral one with non-segmental fixation;  Surgeon: Tia Alert, MD;  Location: [facility] OR;  Service: Neurosurgery;  Laterality: N/A;   LUMBAR FUSION  12/05/2015   LUMBAR LAMINECTOMY/DECOMPRESSION MICRODISCECTOMY Right 06/25/2015   Procedure: LUMBAR LAMINECTOMY/DECOMPRESSION MICRODISCECTOMY 1 LEVEL; RIGHT ;  Surgeon: Tia Alert, MD;  Location: [facility];  Service: Neurosurgery;  Laterality: Right;   WISDOM TOOTH EXTRACTION     Patient Active Problem List   Diagnosis Date Noted   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/2023   S/P cervical spinal fusion 09/29/2022   Failed back syndrome 10/05/2017   Neck pain 10/05/2017   Cervical spondylosis without myelopathy 09/30/2016   Radiculopathy of lumbar region 01/22/2016   Cervical radiculopathy 12/22/2015   S/P lumbar spinal fusion 12/05/2015   Other intervertebral disc degeneration, lumbosacral region 10/14/2015   S/P lumbar laminectomy 06/25/2015     REFERRING PROVIDER:  Huel Cote, MD   REFERRING DIAG:  [phone] (ICD-10-CM) - Tendinopathy of gluteus medius    post op  R glute med repair  Rationale for Evaluation and Treatment: Rehabilitation  THERAPY DIAG:  Pain in right hip  Muscle weakness (generalized)  Difficulty in walking, not elsewhere classified  ONSET  DATE: DOS 9/3   SUBJECTIVE:                                                                                                                                                                                           SUBJECTIVE STATEMENT: Had increase in pain after last land session.  She worked me hard, stretched it out well.  Wearing lift as instructed. Some discomfort but don't know if it is the lift or the good work  out  the other day.   PERTINENT HISTORY:  L5/S1 fusion, cervical surgery x3  PAIN:  PAIN:  Are you having pain? Yes: NPRS scale: 4-5/10 Pain location: Rt hip Pain description: tight Aggravating factors: driving 1 hour to get here Relieving factors: exercises.    PRECAUTIONS:  None  RED FLAGS: None   WEIGHT BEARING RESTRICTIONS:  Yes POSTOPERATIVE PLAN: She will be touchdown weightbearing for 2 weeks  FALLS:  Has patient fallen in last 6 months? No  LIVING ENVIRONMENT: 3 steps to enter, then 2 and both have a railing  OCCUPATION:  Not working  PLOF:  Independent  PATIENT GOALS:  Return to walking for exercise, stairs, hiking  OBJECTIVE:   PATIENT SURVEYS:  FOTO 32   FOTO 10/21: 66  COGNITIVE STATUS: Within functional limits for tasks assessed   SENSATION: WFL   GAIT: Eval: TDWB with 2 wheeled walker 10/21: Lt trendelenburg   Body Part #1 Hip  PALPATION: Eval: guarded end feel, no warmth or redness around bandage  LOWER EXTREMITY ROM:     Passive  Right eval   Hip flexion 90   Hip extension    Hip abduction    Hip adduction    Hip internal rotation    Hip external rotation    Knee flexion    Knee extension    Ankle dorsiflexion    Ankle plantarflexion    Ankle inversion    Ankle eversion     (Blank rows = not tested)      TREATMENT:  Treatment                            10/23  Pt seen for aquatic therapy today.  Treatment took place in water 3.5-4.75 ft in depth at the [facility] pool. Temp of water was 91.  Pt entered/exited the pool via stair using step to pattern with hand rail.  *walking 3.6 ft forward, back and side ways unsupported x 4 widths ea *hip hiking 2 x 10 bottom step R/L *Hip hinge: verbal and TC fore execution. 2 x 5.  Cues for pause to gain LB/hamstring stretch *squatted rest  period *3 way stretch on noodle: hamstring, gastroc, adductor and IT band *figure 4 stretch ue support hand rail R/L x 2 with 10-15s hold *standing ue on wall: clams R/L; 1/2 diamonds; relaxed squat; traditional squat x10 ea.  Cues for core engagement *Cycling on noodle: 2 widths (some left hip discomfort) *walking between exercises for recovery.  Pt requires the buoyancy and hydrostatic pressure of water for support, and to offload joints by unweighting joint load by at least 50 % in navel deep water and by at least 75-80% in chest to neck deep water.  Viscosity of the water is needed for resistance of strengthening. Water current perturbations provides challenge to standing balance requiring increased core activation.  Treatment                            10/21:  Standing hip hike Added 3-ayer heel lift to right shoe Bridge with clam 5x10 Supine HSS- medial & lateral 5 breath holds. Supine piriformis stretch Seated hip hinge with bil LE turnout Weight shift into heel with glut activation   Treatment                            10/14  Pt seen for aquatic therapy today.  Treatment took place in water 3.5-4.75 ft in depth at the [facility] pool. Temp of water was 91.  Pt entered/exited the pool via stair using step to pattern with hand rail.  *intro to setting *walking 4.36ft forward, back and side ways unsupported x 4 widths ea *standing ue on wall: clams R/L; hip add/abd; hip ext *squatted rest period x 2 between above exercises *Cycling on noodle: 2 widths (some right hip discomfort); hip add/abd x 20; hip flex/ext x 20 *decompression on noodle *3 way stretch on noodle: hamstring, gastroc, adductor and IT band *Hip hinge: verbal and TC fore execution. 2 x 5.  Cues for pause to gin LB stretch *STS from 3rd water step  Pt requires the buoyancy and hydrostatic pressure of water for support, and to offload joints by unweighting joint load by at least 50 % in navel deep water  and by at least 75-80% in chest to neck deep water.  Viscosity of the water is needed for resistance of strengthening. Water current perturbations provides challenge to standing balance requiring increased core activation.     Treatment                            10/7:  Bike 4 min L1 STM glut med Sidelying clams Supine HS & ITB stretch with strap Seated hip hinge with dowel Sit<>stand with dowel on spine; also without dowel Single leg hinge to bar Gastroc stretch on slant  board        PATIENT EDUCATION:  Education details: Anatomy of condition, POC, HEP, exercise form/rationale Person educated: Patient and Spouse Education method: Explanation, Demonstration, Tactile cues, Verbal cues, and Handouts Education comprehension: verbalized understanding, returned demonstration, verbal cues required, tactile cues required, and needs further education  HOME EXERCISE PROGRAM: [id number] 3-layer heel lift in right shoe   ASSESSMENT:  CLINICAL IMPRESSION: Pt reports use of lift.  Has had some increase in rle pain (hip  and ankle) but is unable to determine if it is just from exercise. She will continue using.  No reports of LBP throughout session. Continues to progress glute engagement with good toleration. Had pt also focus on core stabilization with exercises for improved execution of all exercises.      REHAB POTENTIAL: Good  CLINICAL DECISION MAKING: Stable/uncomplicated  EVALUATION COMPLEXITY: Low   GOALS: Goals reviewed with patient? Yes  SHORT TERM GOALS: Target date: 10/5  Incision healing well for progression to aquatics Baseline: Goal status: achieved 10/5  2.  Ambulate household distances without AD, pain <2/10 Baseline:  Goal status: achieved 10/5    LONG TERM GOALS: Target date: POC date   Begin progression of walking for exercise program Baseline:  Goal status: INITIAL  2.  Good activation of glut med against gravity in sidelying Baseline:  Goal  status: INITIAL  3.  Meet FOTO goal Baseline:  Goal status: INITIAL  4.  Independent in long term HEP Baseline:  Goal status: INITIAL   PLAN:  PT FREQUENCY: 1-2x/week  PT DURATION: 12 weeks  PLANNED INTERVENTIONS: Therapeutic exercises, Therapeutic activity, Neuromuscular re-education, Balance training, Gait training, Patient/Family education, Self Care, Joint mobilization, Stair training, Aquatic Therapy, Dry Needling, Spinal mobilization, Cryotherapy, Moist heat, Taping, Manual therapy, and Re-evaluation.  PLAN FOR NEXT SESSION: continue per protocol   Corrie Dandy Tomma Lightning) Andi Mahaffy MPT 06/29/23 9:04 AM

## 2023-07-09 ENCOUNTER — Encounter (HOSPITAL_BASED_OUTPATIENT_CLINIC_OR_DEPARTMENT_OTHER): Payer: Self-pay | Admitting: Physical Therapy

## 2023-07-09 ENCOUNTER — Ambulatory Visit (HOSPITAL_BASED_OUTPATIENT_CLINIC_OR_DEPARTMENT_OTHER): Payer: Medicare Other | Attending: Orthopaedic Surgery | Admitting: Physical Therapy

## 2023-07-09 DIAGNOSIS — M6281 Muscle weakness (generalized): Secondary | ICD-10-CM | POA: Insufficient documentation

## 2023-07-09 DIAGNOSIS — M25551 Pain in right hip: Secondary | ICD-10-CM | POA: Insufficient documentation

## 2023-07-09 DIAGNOSIS — R262 Difficulty in walking, not elsewhere classified: Secondary | ICD-10-CM | POA: Diagnosis present

## 2023-07-09 NOTE — Therapy (Signed)
OUTPATIENT PHYSICAL THERAPY TREATMENT   Patient Name: Angela Sims MRN: [id number] DOB:Nov 18, 1970, 52 y.o., female Today's Date: 07/09/2023  END OF SESSION:  PT End of Session - 07/09/23 1001     Visit Number 7    Number of Visits 13    Date for PT Re-Evaluation 08/13/23    PT Start Time 1000    PT Stop Time 1040    PT Time Calculation (min) 40 min    Activity Tolerance Patient tolerated treatment well    Behavior During Therapy [facility] for tasks assessed/performed                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   High cholesterol    Myofascial pain syndrome    PONV (postoperative nausea and vomiting) 12/05/2015   Pseudoarthrosis of lumbar spine    Restless leg syndrome    Seasonal allergies    Past Surgical History:  Procedure Laterality Date   ABDOMINAL EXPOSURE N/A 12/05/2015   Procedure: ABDOMINAL EXPOSURE;  Surgeon: Larina Earthly, MD;  Location: [facility];  Service: Vascular;  Laterality: N/A;   ANTERIOR CERVICAL DECOMP/DISCECTOMY FUSION N/A 09/29/2022   Procedure: Anterior Cervical Discectomy Fusion Cervical Four-Cervical Five;  Surgeon: Tia Alert, MD;  Location: [facility] OR;  Service: Neurosurgery;  Laterality: N/A;  3C   ANTERIOR LUMBAR FUSION N/A 12/05/2015   Procedure: ANTERIOR LUMBAR FUSION LUMBAR FIVE-SACRAL ONE ;  Surgeon: Tia Alert, MD;  Location: [facility];  Service: Neurosurgery;  Laterality: N/A;  Abdominal approach   CERVICAL DISC ARTHROPLASTY N/A 01/22/2016   Procedure: C5-6 Cervical artificial disc replacment;  Surgeon: Tia Alert, MD;  Location: [facility];  Service: Neurosurgery;  Laterality: N/A;  C5-6 Cervical artificial disc replacment   CHOLECYSTECTOMY  08/09/2022   GLUTEUS MINIMUS REPAIR Right 05/10/2023   Procedure: RIGHT GLUTEUS MEDIUS REPAIR WITH COLLAGEN PATCH  AUGMENTATION;  Surgeon: Huel Cote, MD;  Location: [facility];  Service: Orthopedics;  Laterality: Right;   LAMINECTOMY WITH POSTERIOR LATERAL ARTHRODESIS LEVEL 1 N/A 05/12/2018   Procedure: Posterior lateral fusion - Lumbar five-Sacral one with non-segmental fixation;  Surgeon: Tia Alert, MD;  Location: [facility] OR;  Service: Neurosurgery;  Laterality: N/A;   LUMBAR FUSION  12/05/2015   LUMBAR LAMINECTOMY/DECOMPRESSION MICRODISCECTOMY Right 06/25/2015   Procedure: LUMBAR LAMINECTOMY/DECOMPRESSION MICRODISCECTOMY 1 LEVEL; RIGHT ;  Surgeon: Tia Alert, MD;  Location: [facility];  Service: Neurosurgery;  Laterality: Right;   WISDOM TOOTH EXTRACTION     Patient Active Problem List   Diagnosis Date Noted   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/2023   S/P cervical spinal fusion 09/29/2022   Failed back syndrome 10/05/2017   Neck pain 10/05/2017   Cervical spondylosis without myelopathy 09/30/2016   Radiculopathy of lumbar region 01/22/2016   Cervical radiculopathy 12/22/2015   S/P lumbar spinal fusion 12/05/2015   Other intervertebral disc degeneration, lumbosacral region 10/14/2015   S/P lumbar laminectomy 06/25/2015     REFERRING PROVIDER:  Huel Cote, MD   REFERRING DIAG:  [id number] (ICD-10-CM) - Tendinopathy of gluteus medius    post op  R glute med repair  Rationale for Evaluation and Treatment: Rehabilitation  THERAPY DIAG:  Pain in right hip  Muscle weakness (generalized)  Difficulty in walking, not elsewhere classified  ONSET DATE: DOS 9/3   SUBJECTIVE:                                                                                                                                                                                           SUBJECTIVE STATEMENT: Sore from vacation. Tried to do as much exercise as I could. No issue walking on the beach other than fatigue. Night time is still the worst.   PERTINENT HISTORY:  L5/S1 fusion,  cervical surgery x3  PAIN:  PAIN:  Are you having pain? Yes: NPRS scale: 3/10 Pain location: distal glut med attach Pain description: stabbing Aggravating factors: feel it at ight Relieving factors: stretches.    PRECAUTIONS:  None  RED FLAGS: None   WEIGHT BEARING RESTRICTIONS:  Yes POSTOPERATIVE PLAN: She will be touchdown weightbearing for 2 weeks  FALLS:  Has patient fallen in last 6 months? No  LIVING ENVIRONMENT: 3 steps to enter, then 2 and both have a railing  OCCUPATION:  Not working  PLOF:  Independent  PATIENT GOALS:  Return to walking for exercise, stairs, hiking  OBJECTIVE:   PATIENT SURVEYS:  FOTO 32   FOTO 10/21: 66  COGNITIVE STATUS: Within functional limits for tasks assessed   SENSATION: WFL   GAIT: Eval: TDWB with 2 wheeled walker 10/21: Lt trendelenburg   Body Part #1 Hip  PALPATION: Eval: guarded end feel, no warmth or redness around bandage  LOWER EXTREMITY ROM:     Passive  Right eval   Hip flexion 90   Hip extension    Hip abduction    Hip adduction    Hip internal rotation    Hip external rotation    Knee flexion    Knee extension    Ankle dorsiflexion    Ankle plantarflexion    Ankle inversion    Ankle eversion     (Blank rows = not tested)   Hip abduction MMT:  11/2: Rt 28.7  lb      Lt  33.1  lb   TREATMENT:  Treatment                            11/2:  Scar rolling Diver with bar Chair pull off + clams without resist Side stepping in mini squat Lunge pull off bar Antirotation from Lt to Rt- SLS on Rt with left toe prop   Treatment                            10/23  Pt seen for aquatic therapy today.  Treatment took place in water 3.5-4.75 ft in depth at the [facility] pool. Temp of water was 91.  Pt entered/exited the pool via stair using step to pattern with hand  rail.  *walking 3.6 ft forward, back and side ways unsupported x 4 widths ea *hip hiking 2 x 10 bottom step R/L *Hip hinge: verbal and TC fore execution. 2 x 5.  Cues for pause to gain LB/hamstring stretch *squatted rest period *3 way stretch on noodle: hamstring, gastroc, adductor and IT band *figure 4 stretch ue support hand rail R/L x 2 with 10-15s hold *standing ue on wall: clams R/L; 1/2 diamonds; relaxed squat; traditional squat x10 ea.  Cues for core engagement *Cycling on noodle: 2 widths (some left hip discomfort) *walking between exercises for recovery.  Pt requires the buoyancy and hydrostatic pressure of water for support, and to offload joints by unweighting joint load by at least 50 % in navel deep water and by at least 75-80% in chest to neck deep water.  Viscosity of the water is needed for resistance of strengthening. Water current perturbations provides challenge to standing balance requiring increased core activation.  Treatment                            10/21:  Standing hip hike Added 3-ayer heel lift to right shoe Bridge with clam 5x10 Supine HSS- medial & lateral 5 breath holds. Supine piriformis stretch Seated hip hinge with bil LE turnout Weight shift into heel with glut activation        PATIENT EDUCATION:  Education details: Anatomy of condition, POC, HEP, exercise form/rationale Person educated: Patient and Spouse Education method: Explanation, Demonstration, Tactile cues, Verbal cues, and Handouts Education comprehension: verbalized understanding, returned demonstration, verbal cues required, tactile cues required, and needs further education  HOME EXERCISE PROGRAM: [id number] 3-layer heel lift in right shoe   ASSESSMENT:  CLINICAL IMPRESSION: Continued focus on proximal control of knee valgus in [facility]. Excellent strength demo in [place].    REHAB POTENTIAL: Good  CLINICAL DECISION MAKING: Stable/uncomplicated  EVALUATION COMPLEXITY:  Low   GOALS: Goals reviewed with patient? Yes  SHORT TERM GOALS: Target date: 10/5  Incision healing well for progression to aquatics Baseline: Goal status: achieved 10/5  2.  Ambulate household distances without AD, pain <2/10 Baseline:  Goal status: achieved 10/5    LONG TERM GOALS: Target date: POC date   Begin progression of walking for exercise program Baseline:  Goal status: INITIAL  2.  Good activation of glut med against gravity in sidelying Baseline:  Goal status: INITIAL  3.  Meet FOTO goal Baseline:  Goal status: INITIAL  4.  Independent in long term HEP Baseline:  Goal status: INITIAL   PLAN:  PT FREQUENCY: 1-2x/week  PT DURATION: 12 weeks  PLANNED INTERVENTIONS: Therapeutic exercises, Therapeutic activity, Neuromuscular re-education, Balance training, Gait  training, Patient/Family education, Self Care, Joint mobilization, Stair training, Aquatic Therapy, Dry Needling, Spinal mobilization, Cryotherapy, Moist heat, Taping, Manual therapy, and Re-evaluation.  PLAN FOR NEXT SESSION: continue per protocol   Aerilynn Goin C. Omega Slager PT, DPT 07/09/23 10:42 AM

## 2023-07-13 ENCOUNTER — Encounter (HOSPITAL_BASED_OUTPATIENT_CLINIC_OR_DEPARTMENT_OTHER): Payer: Self-pay | Admitting: Physical Therapy

## 2023-07-13 ENCOUNTER — Ambulatory Visit (HOSPITAL_BASED_OUTPATIENT_CLINIC_OR_DEPARTMENT_OTHER): Payer: Medicare Other | Admitting: Physical Therapy

## 2023-07-13 DIAGNOSIS — M25551 Pain in right hip: Secondary | ICD-10-CM

## 2023-07-13 DIAGNOSIS — R262 Difficulty in walking, not elsewhere classified: Secondary | ICD-10-CM

## 2023-07-13 DIAGNOSIS — M6281 Muscle weakness (generalized): Secondary | ICD-10-CM

## 2023-07-13 NOTE — Therapy (Addendum)
OUTPATIENT PHYSICAL THERAPY TREATMENT PHYSICAL THERAPY DISCHARGE SUMMARY  Visits from Start of Care: 8  Current functional level related to goals / functional outcomes: unknown   Remaining deficits: unknown   Education / Equipment: Management of condition/ HEP   Patient agrees to discharge. Patient goals were partially met. Patient is being discharged due to not returning since the last visit.  Addend Corrie Dandy Tomma Lightning) Maddoxx Burkitt MPT 10/05/23 9:43 AM [facility] [address] [place], [place], [address] Phone: [phone]   Fax:  [phone]     Patient Name: Angela Sims MRN: [id number] DOB:Sep 29, 1970, 52 y.o., female Today's Date: 07/13/2023  END OF SESSION:  PT End of Session - 07/13/23 1032     Visit Number 8    Number of Visits 13    Date for PT Re-Evaluation 08/13/23    PT Start Time 1031    PT Stop Time 1115    PT Time Calculation (min) 44 min    Activity Tolerance Patient tolerated treatment well    Behavior During Therapy [facility] for tasks assessed/performed                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   High cholesterol    Myofascial pain syndrome    PONV (postoperative nausea and vomiting) 12/05/2015   Pseudoarthrosis of lumbar spine    Restless leg syndrome    Seasonal allergies    Past Surgical History:  Procedure Laterality Date   ABDOMINAL EXPOSURE N/A 12/05/2015   Procedure: ABDOMINAL EXPOSURE;  Surgeon: Larina Earthly, MD;  Location: [facility];  Service: Vascular;  Laterality: N/A;   ANTERIOR CERVICAL DECOMP/DISCECTOMY FUSION N/A 09/29/2022   Procedure: Anterior Cervical Discectomy Fusion Cervical Four-Cervical Five;  Surgeon: Tia Alert, MD;  Location[facility];  Service: Neurosurgery;  Laterality: N/A;  3C   ANTERIOR LUMBAR  FUSION N/A 12/05/2015   Procedure: ANTERIOR LUMBAR FUSION LUMBAR FIVE-SACRAL ONE ;  Surgeon: Tia Alert, MD;  Location: [facility];  Service: Neurosurgery;  Laterality: N/A;  Abdominal approach   CERVICAL DISC ARTHROPLASTY N/A 01/22/2016   Procedure: C5-6 Cervical artificial disc replacment;  Surgeon: Tia Alert, MD;  Location: [facility];  Service: Neurosurgery;  Laterality: N/A;  C5-6 Cervical artificial disc replacment   CHOLECYSTECTOMY  08/09/2022   GLUTEUS MINIMUS REPAIR Right 05/10/2023   Procedure: RIGHT GLUTEUS MEDIUS REPAIR WITH COLLAGEN PATCH AUGMENTATION;  Surgeon: Huel Cote, MD;  Location: [facility];  Service: Orthopedics;  Laterality: Right;   LAMINECTOMY WITH POSTERIOR LATERAL ARTHRODESIS LEVEL 1 N/A 05/12/2018   Procedure: Posterior lateral fusion - Lumbar five-Sacral one with non-segmental fixation;  Surgeon: Tia Alert, MD;  Location: [facility];  Service: Neurosurgery;  Laterality: N/A;   LUMBAR FUSION  12/05/2015   LUMBAR LAMINECTOMY/DECOMPRESSION MICRODISCECTOMY Right 06/25/2015   Procedure: LUMBAR LAMINECTOMY/DECOMPRESSION MICRODISCECTOMY 1 LEVEL; RIGHT ;  Surgeon: Tia Alert, MD;  Location: [facility];  Service: Neurosurgery;  Laterality: Right;   WISDOM TOOTH EXTRACTION     Patient Active Problem List   Diagnosis Date Noted   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/2023   S/P cervical spinal fusion 09/29/2022   Failed back syndrome 10/05/2017   Neck pain 10/05/2017   Cervical spondylosis without myelopathy 09/30/2016   Radiculopathy  of lumbar region 01/22/2016   Cervical radiculopathy 12/22/2015   S/P lumbar spinal fusion 12/05/2015   Other intervertebral disc degeneration, lumbosacral region 10/14/2015   S/P lumbar laminectomy 06/25/2015     REFERRING PROVIDER:  Huel Cote, MD   REFERRING DIAG:  [phone] (ICD-10-CM) - Tendinopathy of gluteus medius    post op  R glute med repair  Rationale for Evaluation and Treatment:  Rehabilitation  THERAPY DIAG:  Pain in right hip  Muscle weakness (generalized)  Difficulty in walking, not elsewhere classified  ONSET DATE: DOS 9/3   SUBJECTIVE:                                                                                                                                                                                           SUBJECTIVE STATEMENT: Sore from last session although she told me I would be.  It is muscle soreness vs pain"  PERTINENT HISTORY:  L5/S1 fusion, cervical surgery x3  PAIN:  PAIN:  Are you having pain? Yes: NPRS scale: 4-5/10 Pain location: distal glut med attach Pain description: stabbing Aggravating factors: feel it at ight Relieving factors: stretches.    PRECAUTIONS:  None  RED FLAGS: None   WEIGHT BEARING RESTRICTIONS:  Yes POSTOPERATIVE PLAN: She will be touchdown weightbearing for 2 weeks  FALLS:  Has patient fallen in last 6 months? No  LIVING ENVIRONMENT: 3 steps to enter, then 2 and both have a railing  OCCUPATION:  Not working  PLOF:  Independent  PATIENT GOALS:  Return to walking for exercise, stairs, hiking  OBJECTIVE:   PATIENT SURVEYS:  FOTO 32   FOTO 10/21: 66  COGNITIVE STATUS: Within functional limits for tasks assessed   SENSATION: WFL   GAIT: Eval: TDWB with 2 wheeled walker 10/21: Lt trendelenburg   Body Part #1 Hip  PALPATION: Eval: guarded end feel, no warmth or redness around bandage  LOWER EXTREMITY ROM:     Passive  Right eval   Hip flexion 90   Hip extension    Hip abduction    Hip adduction    Hip internal rotation    Hip external rotation    Knee flexion    Knee extension    Ankle dorsiflexion    Ankle plantarflexion    Ankle inversion    Ankle eversion     (Blank rows = not tested)   Hip abduction MMT:  11/2: Rt 28.7  lb      Lt  33.1  lb   TREATMENT:  Treatment                            07/13/23  Pt seen for aquatic therapy today.  Treatment took place in water 3.5-4.75 ft in depth at the [facility] pool. Temp of water was 91.  Pt entered/exited the pool via stair using step to pattern with hand rail.  *walking 3.6 ft forward, back and side ways unsupported x 4 widths ea *side lunges with ue add/abd yellow HB x 6 widths. VC and demonstration for execution *Hip hinge: verbal and TC for execution *hip hiking 2 x 10 bottom step R/L *SLS R/L ue support noodle 3.6 ft.  - single leg R/L clam x 10  - TR; HR x 15; cursty squats x 12 *Cycling on noodle; hip add/abd; skiing *squatted rest period *3 way stretch on noodle: hamstring, gastroc, adductor and IT band *figure 4 stretch ue support hand rail R/L x 2 with 10-15s hold *standing ue on wall: clams R/L; 1/2 diamonds; relaxed squat; traditional squat x10 ea.  Cues for core engagement  *walking between exercises for recovery.  Pt requires the buoyancy and hydrostatic pressure of water for support, and to offload joints by unweighting joint load by at least 50 % in navel deep water and by at least 75-80% in chest to neck deep water.  Viscosity of the water is needed for resistance of strengthening. Water current perturbations provides challenge to standing balance requiring increased core activation.   Treatment                            11/2:  Scar rolling Diver with bar Chair pull off + clams without resist Side stepping in mini squat Lunge pull off bar Antirotation from Lt to Rt- SLS on Rt with left toe prop   Treatment                            10/23  Pt seen for aquatic therapy today.  Treatment took place in water 3.5-4.75 ft in depth at the [facility] pool. Temp of water was 91.  Pt entered/exited the pool via stair using step to pattern with hand rail.  *walking 3.6 ft forward, back and side ways unsupported x 4 widths ea *hip  hiking 2 x 10 bottom step R/L *Hip hinge: verbal and TC fore execution. 2 x 5.  Cues for pause to gain LB/hamstring stretch *squatted rest period *3 way stretch on noodle: hamstring, gastroc, adductor and IT band *figure 4 stretch ue support hand rail R/L x 2 with 10-15s hold *standing ue on wall: clams R/L; 1/2 diamonds; relaxed squat; traditional squat x10 ea.  Cues for core engagement *Cycling on noodle: 2 widths (some left hip discomfort) *walking between exercises for recovery.  Pt requires the buoyancy and hydrostatic pressure of water for support, and to offload joints by unweighting joint load by at least 50 % in navel deep water and by at least 75-80% in chest to neck deep water.  Viscosity of the water is needed for resistance of strengthening. Water current perturbations provides challenge to standing balance requiring increased core activation.  Treatment                            10/21:  Standing hip hike Added 3-ayer heel lift to  right shoe Bridge with clam 5x10 Supine HSS- medial & lateral 5 breath holds. Supine piriformis stretch Seated hip hinge with bil LE turnout Weight shift into heel with glut activation        PATIENT EDUCATION:  Education details: Anatomy of condition, POC, HEP, exercise form/rationale Person educated: Patient and Spouse Education method: Explanation, Demonstration, Tactile cues, Verbal cues, and Handouts Education comprehension: verbalized understanding, returned demonstration, verbal cues required, tactile cues required, and needs further education  HOME EXERCISE PROGRAM: [id number] 3-layer heel lift in right shoe   ASSESSMENT:  CLINICAL IMPRESSION: Some difficulty coming into full knee extension and hip neutral with side lunging as we continued to focus on proximal knee control in CKC. Cues for slowed movement improves.  She demonstrates good tolerance and less pain with session.     REHAB POTENTIAL: Good  CLINICAL DECISION  MAKING: Stable/uncomplicated  EVALUATION COMPLEXITY: Low   GOALS: Goals reviewed with patient? Yes  SHORT TERM GOALS: Target date: 10/5  Incision healing well for progression to aquatics Baseline: Goal status: achieved 10/5  2.  Ambulate household distances without AD, pain <2/10 Baseline:  Goal status: achieved 10/5    LONG TERM GOALS: Target date: POC date   Begin progression of walking for exercise program Baseline:  Goal status: INITIAL  2.  Good activation of glut med against gravity in sidelying Baseline:  Goal status: INITIAL  3.  Meet FOTO goal Baseline:  Goal status: INITIAL  4.  Independent in long term HEP Baseline:  Goal status: INITIAL   PLAN:  PT FREQUENCY: 1-2x/week  PT DURATION: 12 weeks  PLANNED INTERVENTIONS: Therapeutic exercises, Therapeutic activity, Neuromuscular re-education, Balance training, Gait training, Patient/Family education, Self Care, Joint mobilization, Stair training, Aquatic Therapy, Dry Needling, Spinal mobilization, Cryotherapy, Moist heat, Taping, Manual therapy, and Re-evaluation.  PLAN FOR NEXT SESSION: continue per protocol   Rushie Chestnut) Calyn Rubi MPT 07/13/23 10:33 AM [facility] [address] [place], [place], [address] Phone: [phone]   Fax:  [phone]

## 2023-07-15 ENCOUNTER — Ambulatory Visit (HOSPITAL_BASED_OUTPATIENT_CLINIC_OR_DEPARTMENT_OTHER): Payer: Medicare Other | Admitting: Physical Therapy

## 2023-07-19 ENCOUNTER — Ambulatory Visit (HOSPITAL_BASED_OUTPATIENT_CLINIC_OR_DEPARTMENT_OTHER): Payer: Medicare Other | Admitting: Physical Therapy

## 2023-07-21 ENCOUNTER — Ambulatory Visit (HOSPITAL_BASED_OUTPATIENT_CLINIC_OR_DEPARTMENT_OTHER): Payer: Medicare Other | Admitting: Physical Therapy

## 2023-07-26 ENCOUNTER — Ambulatory Visit (HOSPITAL_BASED_OUTPATIENT_CLINIC_OR_DEPARTMENT_OTHER): Payer: Medicare Other | Admitting: Physical Therapy

## 2023-07-28 ENCOUNTER — Ambulatory Visit (HOSPITAL_BASED_OUTPATIENT_CLINIC_OR_DEPARTMENT_OTHER): Payer: Medicare Other | Admitting: Physical Therapy

## 2023-07-29 ENCOUNTER — Ambulatory Visit (HOSPITAL_BASED_OUTPATIENT_CLINIC_OR_DEPARTMENT_OTHER): Payer: Medicare Other | Admitting: Physical Therapy

## 2023-08-03 ENCOUNTER — Ambulatory Visit (HOSPITAL_BASED_OUTPATIENT_CLINIC_OR_DEPARTMENT_OTHER): Payer: Medicare Other | Admitting: Orthopaedic Surgery

## 2023-08-03 DIAGNOSIS — M67959 Unspecified disorder of synovium and tendon, unspecified thigh: Secondary | ICD-10-CM

## 2023-08-03 NOTE — Progress Notes (Signed)
Post Operative Evaluation    Procedure/Date of Surgery: Right gluteus medius tendon repair 9/3  Interval History:    Presents today for follow-up status post the above procedure.  Overall she is doing extremely well.  She is now back to walking essentially normal without pain.  She does have discomfort laying on the side although this is improving   PMH/PSH/Family History/Social History/Meds/Allergies:    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   High cholesterol    Myofascial pain syndrome    PONV (postoperative nausea and vomiting) 12/05/2015   Pseudoarthrosis of lumbar spine    Restless leg syndrome    Seasonal allergies    Past Surgical History:  Procedure Laterality Date   ABDOMINAL EXPOSURE N/A 12/05/2015   Procedure: ABDOMINAL EXPOSURE;  Surgeon: Larina Earthly, MD;  Location: [facility];  Service: Vascular;  Laterality: N/A;   ANTERIOR CERVICAL DECOMP/DISCECTOMY FUSION N/A 09/29/2022   Procedure: Anterior Cervical Discectomy Fusion Cervical Four-Cervical Five;  Surgeon: Tia Alert, MD;  Location: [facility] OR;  Service: Neurosurgery;  Laterality: N/A;  3C   ANTERIOR LUMBAR FUSION N/A 12/05/2015   Procedure: ANTERIOR LUMBAR FUSION LUMBAR FIVE-SACRAL ONE ;  Surgeon: Tia Alert, MD;  Location: [facility];  Service: Neurosurgery;  Laterality: N/A;  Abdominal approach   CERVICAL DISC ARTHROPLASTY N/A 01/22/2016   Procedure: C5-6 Cervical artificial disc replacment;  Surgeon: Tia Alert, MD;  Location: [facility];  Service: Neurosurgery;  Laterality: N/A;  C5-6 Cervical artificial disc replacment   CHOLECYSTECTOMY  08/09/2022   GLUTEUS MINIMUS REPAIR Right 05/10/2023   Procedure: RIGHT GLUTEUS MEDIUS REPAIR WITH COLLAGEN PATCH AUGMENTATION;  Surgeon: Huel Cote, MD;  Location: [facility];  Service: Orthopedics;  Laterality: Right;   LAMINECTOMY WITH POSTERIOR LATERAL ARTHRODESIS LEVEL 1 N/A 05/12/2018   Procedure: Posterior lateral fusion - Lumbar five-Sacral one with non-segmental fixation;  Surgeon: Tia Alert, MD;  Location: [facility] OR;  Service: Neurosurgery;  Laterality: N/A;   LUMBAR FUSION  12/05/2015   LUMBAR LAMINECTOMY/DECOMPRESSION MICRODISCECTOMY Right 06/25/2015   Procedure: LUMBAR LAMINECTOMY/DECOMPRESSION MICRODISCECTOMY 1 LEVEL; RIGHT ;  Surgeon: Tia Alert, MD;  Location: [facility];  Service: Neurosurgery;  Laterality: Right;   WISDOM TOOTH EXTRACTION     Social History   Socioeconomic History   Marital status: Married    Spouse name: Not on file   Number of children: 1   Years of education: Not on file   Highest education level: Not on file  Occupational History   Not on file  Tobacco Use   Smoking status: Never   Smokeless tobacco: Never  Vaping Use   Vaping status: Never Used  Substance and Sexual Activity   Alcohol use: Not Currently   Drug use: No   Sexual activity: Not on file  Other Topics Concern   Not on file  Social History Narrative   Not on file   Social Determinants of Health   Financial Resource Strain: Low Risk  (10/26/2022)   Received from [facility], [facility]   Overall Financial Resource Strain (CARDIA)  Difficulty of Paying Living Expenses: Not hard at all  Food Insecurity: No Food Insecurity (10/26/2022)   Received from [facility], [facility]   Hunger Vital Sign    Worried About Running Out of Food in the Last Year: Never true    Ran Out of Food in the Last Year: Never true  Transportation Needs: No Transportation Needs (10/26/2022)   Received from [facility], [facility]   [facility] - Transportation    Lack of Transportation (Medical): No    Lack of Transportation (Non-Medical): No  Physical Activity: Unknown (10/26/2022)   Received from [facility]   Exercise Vital Sign     Days of Exercise per Week: 0 days    Minutes of Exercise per Session: Not on file  Recent Concern: Physical Activity - Inactive (10/26/2022)   Received from [facility], [facility]   Exercise Vital Sign    Days of Exercise per Week: 0 days    Minutes of Exercise per Session: 30 min  Stress: No Stress Concern Present (10/26/2022)   Received from [facility], [facility] - Occupational Stress Questionnaire    Feeling of Stress : Not at all  Social Connections: Unknown (07/11/2023)   [facility]   Social Network    Social Network: Not on file   Family History  Problem Relation Age of Onset   Bladder Cancer Mother    Alcohol abuse Father    Allergies  Allergen Reactions   Clavulanic Acid Other (See Comments)    Unknown   Cyclobenzaprine Other (See Comments)    Pt stated, "I got restless leg syndrome with this medication"   Lactose Intolerance (Gi) Other (See Comments)    Gassy / Cramps   Gabapentin Other (See Comments)    moody   Penicillins Rash    Has patient had a PCN reaction causing immediate rash, facial/tongue/throat swelling, SOB or lightheadedness with hypotension: Yes Has patient had a PCN reaction causing severe rash involving mucus membranes or skin necrosis: No Has patient had a PCN reaction that required hospitalization: No Has patient had a PCN reaction occurring within the last 10 years: Yes If all of the above answers are "NO", then may proceed with Cephalosporin use.   Current Outpatient Medications  Medication Sig Dispense Refill   albuterol (PROVENTIL HFA;VENTOLIN HFA) 108 (90 BASE) MCG/ACT inhaler Inhale 1-2 puffs into the lungs daily as needed for wheezing or shortness of breath.      aspirin EC 325 MG tablet Take 1 tablet (325 mg total) by mouth daily. 30 tablet 0   cephALEXin (KEFLEX) 500 MG capsule Take 500 mg by mouth 4 (four) times daily.     [employer] FIBER GUMMIES PO Take by mouth.      fluticasone (FLONASE) 50 MCG/ACT nasal spray Place 1 spray into both nostrils daily as needed for allergies.      HYDROcodone-acetaminophen (NORCO/VICODIN) 5-325 MG tablet Take 1 tablet by mouth every 6 (six) hours as needed for moderate pain. 25 tablet 0   methocarbamol (ROBAXIN) 500 MG tablet Take 1 tablet (500 mg total) by mouth 4 (four) times daily. 30 tablet 3   oxycodone (OXY-IR) 5 MG capsule Take 1 capsule (5 mg total) by mouth every 4 (four) hours as needed (severe pain). 20 capsule 0   rosuvastatin (CRESTOR) 5 MG tablet Take 5 mg by mouth at bedtime.     scopolamine (TRANSDERM-SCOP) 1 MG/3DAYS Place 1 patch (1.5 mg total)  onto the skin every 3 (three) days. 10 patch 12   tiZANidine (ZANAFLEX) 2 MG tablet Take 1 tablet (2 mg total) by mouth every 8 (eight) hours as needed (Neck pain). 30 tablet 0   traZODone (DESYREL) 50 MG tablet Take 50-100 mg by mouth at bedtime.     Upadacitinib ER (RINVOQ) 15 MG TB24 Take 15 mg by mouth daily.     No current facility-administered medications for this visit.   No results found.  Review of Systems:   A ROS was performed including pertinent positives and negatives as documented in the HPI.   Musculoskeletal Exam:    Right hip incision is well-appearing without erythema or drainage.  30 degrees internal/external rotation about the right hip.  Able to flex to 90 degrees.  Improved hip abduction.    Distal neurosensory exam is intact with  Imaging:      I personally reviewed and interpreted the radiographs.   Assessment:   12 weeks status post right hip gluteus medius repair overall doing extremely well.  She is feeling stronger and is progressing to a normal gait without assistive devices.  Overall I do believe that she is doing quite well I will plan to see her back as needed  Plan :    -Return to clinic as needed      I personally saw and evaluated the patient, and participated in the management and treatment plan.  Huel Cote,  MD Attending Physician, Orthopedic Surgery  This document was dictated using Dragon voice recognition software. A reasonable attempt at proof reading has been made to minimize errors.

## 2024-07-09 ENCOUNTER — Encounter: Payer: Self-pay | Admitting: Radiology

## 2024-11-07 ENCOUNTER — Ambulatory Visit (HOSPITAL_BASED_OUTPATIENT_CLINIC_OR_DEPARTMENT_OTHER): Admitting: Orthopaedic Surgery
